# Patient Record
Sex: Female | Born: 1990 | Race: Asian | Hispanic: No | Marital: Married | Smoking: Never smoker
Health system: Southern US, Community
[De-identification: ages and names within clinical notes are randomized; demographics above are authoritative.]

---

## 2019-02-13 LAB — OB RESULTS CONSOLE ABO/RH: RH Type: POSITIVE

## 2019-02-13 LAB — OB RESULTS CONSOLE RUBELLA ANTIBODY, IGM: Rubella: IMMUNE

## 2019-02-13 LAB — OB RESULTS CONSOLE GC/CHLAMYDIA
Chlamydia: NEGATIVE
Gonorrhea: NEGATIVE

## 2019-02-13 LAB — OB RESULTS CONSOLE RPR: RPR: NONREACTIVE

## 2019-02-13 LAB — OB RESULTS CONSOLE HEPATITIS B SURFACE ANTIGEN: Hepatitis B Surface Ag: NEGATIVE

## 2019-02-13 LAB — OB RESULTS CONSOLE HIV ANTIBODY (ROUTINE TESTING): HIV: NONREACTIVE

## 2019-02-13 LAB — OB RESULTS CONSOLE ANTIBODY SCREEN: Antibody Screen: NEGATIVE

## 2019-06-13 LAB — OB RESULTS CONSOLE GBS: GBS: NEGATIVE

## 2019-07-12 ENCOUNTER — Other Ambulatory Visit: Payer: Self-pay | Admitting: Obstetrics and Gynecology

## 2019-07-13 ENCOUNTER — Telehealth (HOSPITAL_COMMUNITY): Payer: Self-pay | Admitting: *Deleted

## 2019-07-13 ENCOUNTER — Encounter (HOSPITAL_COMMUNITY): Payer: Self-pay | Admitting: *Deleted

## 2019-07-13 NOTE — Telephone Encounter (Signed)
Preadmission screen  

## 2019-07-14 NOTE — L&D Delivery Note (Signed)
Pt continued to progress spontaneously to C/C/+1. She had an epidural. She labored down then began to push. She pushed for 45 min and had a SVD of one live viable female infant over a second degree midline tear. Placenta-S/I with calcifications. She had some mild uterine atony txd with one dose of methergine. Tear closed with 3-0 vicryl. Baby to [facility] [id number]

## 2019-07-15 ENCOUNTER — Other Ambulatory Visit (HOSPITAL_COMMUNITY): Admission: RE | Admit: 2019-07-15 | Payer: Medicaid Other | Source: Ambulatory Visit

## 2019-07-16 ENCOUNTER — Other Ambulatory Visit: Payer: Self-pay

## 2019-07-17 ENCOUNTER — Inpatient Hospital Stay (HOSPITAL_COMMUNITY): Payer: Medicaid Other | Admitting: Anesthesiology

## 2019-07-17 ENCOUNTER — Encounter (HOSPITAL_COMMUNITY): Payer: Self-pay | Admitting: Obstetrics and Gynecology

## 2019-07-17 ENCOUNTER — Inpatient Hospital Stay (HOSPITAL_COMMUNITY): Payer: Medicaid Other

## 2019-07-17 ENCOUNTER — Inpatient Hospital Stay (HOSPITAL_COMMUNITY)
Admission: AD | Admit: 2019-07-17 | Discharge: 2019-07-19 | DRG: 807 | Disposition: A | Discharge status: Home / Self Care | Payer: Medicaid Other | Attending: Obstetrics and Gynecology | Admitting: Obstetrics and Gynecology

## 2019-07-17 ENCOUNTER — Other Ambulatory Visit: Payer: Self-pay

## 2019-07-17 ENCOUNTER — Other Ambulatory Visit (HOSPITAL_COMMUNITY): Payer: Self-pay

## 2019-07-17 DIAGNOSIS — O9902 Anemia complicating childbirth: Secondary | ICD-10-CM | POA: Diagnosis present

## 2019-07-17 DIAGNOSIS — Z20822 Contact with and (suspected) exposure to covid-19: Secondary | ICD-10-CM | POA: Diagnosis present

## 2019-07-17 DIAGNOSIS — O480 Post-term pregnancy: Secondary | ICD-10-CM | POA: Diagnosis present

## 2019-07-17 DIAGNOSIS — D649 Anemia, unspecified: Secondary | ICD-10-CM | POA: Diagnosis present

## 2019-07-17 DIAGNOSIS — Z3A41 41 weeks gestation of pregnancy: Secondary | ICD-10-CM

## 2019-07-17 DIAGNOSIS — Z3490 Encounter for supervision of normal pregnancy, unspecified, unspecified trimester: Secondary | ICD-10-CM

## 2019-07-17 LAB — CBC
HCT: 40.1 % (ref 36.0–46.0)
Hemoglobin: 12.3 g/dL (ref 12.0–15.0)
MCH: 23.9 pg — ABNORMAL LOW (ref 26.0–34.0)
MCHC: 30.7 g/dL (ref 30.0–36.0)
MCV: 78 fL — ABNORMAL LOW (ref 80.0–100.0)
Platelets: 218 10*3/uL (ref 150–400)
RBC: 5.14 MIL/uL — ABNORMAL HIGH (ref 3.87–5.11)
RDW: 14.5 % (ref 11.5–15.5)
WBC: 7.6 10*3/uL (ref 4.0–10.5)
nRBC: 0 % (ref 0.0–0.2)

## 2019-07-17 LAB — TYPE AND SCREEN
ABO/RH(D): O POS
Antibody Screen: NEGATIVE

## 2019-07-17 LAB — ABO/RH: ABO/RH(D): O POS

## 2019-07-17 LAB — SARS CORONAVIRUS 2 (TAT 6-24 HRS): SARS Coronavirus 2: NEGATIVE

## 2019-07-17 MED ORDER — OXYCODONE-ACETAMINOPHEN 5-325 MG PO TABS: 2.0000 | ORAL_TABLET | ORAL | Status: DC | PRN

## 2019-07-17 MED ORDER — OXYTOCIN 40 UNITS IN NORMAL SALINE INFUSION - SIMPLE MED
2.5000 [IU]/h | INTRAVENOUS | Status: DC
  Filled 2019-07-17: qty 1000

## 2019-07-17 MED ORDER — LACTATED RINGERS IV SOLN: INTRAVENOUS | Status: DC

## 2019-07-17 MED ORDER — ONDANSETRON HCL 4 MG/2ML IJ SOLN: 4.0000 mg | INTRAMUSCULAR | Status: DC | PRN

## 2019-07-17 MED ORDER — TERBUTALINE SULFATE 1 MG/ML IJ SOLN: 0.2500 mg | Freq: Once | INTRAMUSCULAR | Status: DC | PRN

## 2019-07-17 MED ORDER — WITCH HAZEL-GLYCERIN EX PADS: 1.0000 "application " | MEDICATED_PAD | CUTANEOUS | Status: DC | PRN

## 2019-07-17 MED ORDER — SENNOSIDES-DOCUSATE SODIUM 8.6-50 MG PO TABS
2.0000 | ORAL_TABLET | ORAL | Status: DC
  Administered 2019-07-18 (×2): 2 via ORAL
  Filled 2019-07-17 (×2): qty 2

## 2019-07-17 MED ORDER — OXYTOCIN 40 UNITS IN NORMAL SALINE INFUSION - SIMPLE MED: 1.0000 m[IU]/min | INTRAVENOUS | Status: DC

## 2019-07-17 MED ORDER — BENZOCAINE-MENTHOL 20-0.5 % EX AERO
1.0000 "application " | INHALATION_SPRAY | CUTANEOUS | Status: DC | PRN
  Administered 2019-07-17: 1 via TOPICAL
  Filled 2019-07-17: qty 56

## 2019-07-17 MED ORDER — LIDOCAINE HCL (PF) 1 % IJ SOLN
INTRAMUSCULAR | Status: DC | PRN
  Administered 2019-07-17: 6 mL via EPIDURAL

## 2019-07-17 MED ORDER — METHYLERGONOVINE MALEATE 0.2 MG/ML IJ SOLN
INTRAMUSCULAR | Status: AC
  Filled 2019-07-17: qty 1

## 2019-07-17 MED ORDER — IBUPROFEN 600 MG PO TABS
600.0000 mg | ORAL_TABLET | Freq: Four times a day (QID) | ORAL | Status: DC
  Administered 2019-07-17 – 2019-07-18 (×6): 600 mg via ORAL
  Filled 2019-07-17 (×7): qty 1

## 2019-07-17 MED ORDER — COCONUT OIL OIL: 1.0000 "application " | TOPICAL_OIL | Status: DC | PRN

## 2019-07-17 MED ORDER — SODIUM CHLORIDE (PF) 0.9 % IJ SOLN
INTRAMUSCULAR | Status: DC | PRN
  Administered 2019-07-17: 12 mL/h via EPIDURAL

## 2019-07-17 MED ORDER — FENTANYL-BUPIVACAINE-NACL 0.5-0.125-0.9 MG/250ML-% EP SOLN
EPIDURAL | Status: AC
  Filled 2019-07-17: qty 250

## 2019-07-17 MED ORDER — ACETAMINOPHEN 325 MG PO TABS
650.0000 mg | ORAL_TABLET | ORAL | Status: DC | PRN
  Administered 2019-07-17: 650 mg via ORAL
  Filled 2019-07-17: qty 2

## 2019-07-17 MED ORDER — TETANUS-DIPHTH-ACELL PERTUSSIS 5-2.5-18.5 LF-MCG/0.5 IM SUSP: 0.5000 mL | Freq: Once | INTRAMUSCULAR | Status: DC

## 2019-07-17 MED ORDER — ACETAMINOPHEN 325 MG PO TABS: 650.0000 mg | ORAL_TABLET | ORAL | Status: DC | PRN

## 2019-07-17 MED ORDER — LIDOCAINE HCL (PF) 1 % IJ SOLN: 30.0000 mL | INTRAMUSCULAR | Status: DC | PRN

## 2019-07-17 MED ORDER — FENTANYL CITRATE (PF) 100 MCG/2ML IJ SOLN: 100.0000 ug | Freq: Once | INTRAMUSCULAR | Status: DC

## 2019-07-17 MED ORDER — OXYTOCIN BOLUS FROM INFUSION
500.0000 mL | Freq: Once | INTRAVENOUS | Status: AC
  Administered 2019-07-17: 500 mL via INTRAVENOUS

## 2019-07-17 MED ORDER — ONDANSETRON HCL 4 MG PO TABS: 4.0000 mg | ORAL_TABLET | ORAL | Status: DC | PRN

## 2019-07-17 MED ORDER — MISOPROSTOL 25 MCG QUARTER TABLET
25.0000 ug | ORAL_TABLET | ORAL | Status: DC | PRN
  Administered 2019-07-17: 25 ug via VAGINAL
  Filled 2019-07-17 (×2): qty 1

## 2019-07-17 MED ORDER — MEASLES, MUMPS & RUBELLA VAC IJ SOLR: 0.5000 mL | Freq: Once | INTRAMUSCULAR | Status: DC

## 2019-07-17 MED ORDER — FENTANYL CITRATE (PF) 100 MCG/2ML IJ SOLN
INTRAMUSCULAR | Status: AC
  Administered 2019-07-17: 100 ug via INTRAVENOUS
  Filled 2019-07-17: qty 2

## 2019-07-17 MED ORDER — DIBUCAINE (PERIANAL) 1 % EX OINT: 1.0000 "application " | TOPICAL_OINTMENT | CUTANEOUS | Status: DC | PRN

## 2019-07-17 MED ORDER — ONDANSETRON HCL 4 MG/2ML IJ SOLN: 4.0000 mg | Freq: Four times a day (QID) | INTRAMUSCULAR | Status: DC | PRN

## 2019-07-17 MED ORDER — SOD CITRATE-CITRIC ACID 500-334 MG/5ML PO SOLN: 30.0000 mL | ORAL | Status: DC | PRN

## 2019-07-17 MED ORDER — OXYCODONE-ACETAMINOPHEN 5-325 MG PO TABS: 1.0000 | ORAL_TABLET | ORAL | Status: DC | PRN

## 2019-07-17 MED ORDER — FENTANYL CITRATE (PF) 100 MCG/2ML IJ SOLN: 100.0000 ug | INTRAMUSCULAR | Status: DC | PRN

## 2019-07-17 MED ORDER — SIMETHICONE 80 MG PO CHEW: 80.0000 mg | CHEWABLE_TABLET | ORAL | Status: DC | PRN

## 2019-07-17 MED ORDER — METHYLERGONOVINE MALEATE 0.2 MG/ML IJ SOLN
0.2000 mg | Freq: Once | INTRAMUSCULAR | Status: AC
  Administered 2019-07-17: 0.2 mg via INTRAMUSCULAR

## 2019-07-17 MED ORDER — ZOLPIDEM TARTRATE 5 MG PO TABS: 5.0000 mg | ORAL_TABLET | Freq: Every evening | ORAL | Status: DC | PRN

## 2019-07-17 MED ORDER — LACTATED RINGERS IV SOLN: 500.0000 mL | INTRAVENOUS | Status: DC | PRN

## 2019-07-17 NOTE — Addendum Note (Signed)
Addendum  created 07/17/19 1924 by Lenox Ahr, CRNA   Charge Capture section accepted

## 2019-07-17 NOTE — Lactation Note (Signed)
This note was copied from a Mary Myers's chart. Lactation Consultation Note  Patient Name: Mary Myers Today's Date: 07/17/2019 Reason for consult: Initial assessment;Primapara;1st time breastfeeding;Term  Interpretor: Mary Myers [id number]. I assisted Mary Myers with breast feeding. Mary Myers has not latched to date. She has everted nipples and wide spaced breasts. We practiced hand expression, and her colostrum is easily expressed.   Mary Myers has been very sleepy today. I attempted to wake by changing a large meconium diaper. He roused somewhat and we practiced football hold and cradle hold while Mary Myers was STS. He licked colostrum off the breast and nuzzled and opened to latch a few times, but he never fully latched. He did some smacking/kissing at the breast. He seemed almost ready to latch, but we were unable to attain it.  I answered questions about how to know if Mary Myers is getting enough to eat. I recommended that she hold Mary Myers skin to skin when she is awake and to allow Mary Myers to explore the breast. While formula is not yet medically necessary, I indicated that this option is available to her if she is concerned, and I recommended that she contact her RN about this tonight, as needed. I suggested that she begin pumping if she did opt to supplement for stimulation purposes. Mary Myers verbalized understanding of this plan.  I recommended LC follow up tomorrow.  Maternal Data Formula Feeding for Exclusion: No Has patient been taught Hand Expression?: Yes Does the patient have breastfeeding experience prior to this delivery?: No  Feeding Feeding Type: Breast Fed  LATCH Score Latch: Too sleepy or reluctant, no latch achieved, no sucking elicited.  Audible Swallowing: None  Type of Nipple: Everted at rest and after stimulation  Comfort (Breast/Nipple): Soft / non-tender  Hold (Positioning): Assistance needed to correctly position infant at breast and maintain latch.  LATCH Score: 5  Interventions Interventions:  Breast feeding basics reviewed;Assisted with latch;Skin to skin;Hand express;Breast compression;Adjust position;Support pillows;Position options   Consult Status Consult Status: Follow-up Date: 07/18/19 Follow-up type: In-patient    Mary Myers 07/17/2019, 11:44 PM

## 2019-07-17 NOTE — Anesthesia Preprocedure Evaluation (Signed)
Anesthesia Evaluation  Patient identified by MRN, date of birth, ID band Patient awake    Reviewed: Allergy & Precautions, H&P , NPO status , Patient's Chart, lab work & pertinent test results, reviewed documented beta blocker date and time   Airway Mallampati: II  TM Distance: >3 FB Neck ROM: full    Dental no notable dental hx.    Pulmonary neg pulmonary ROS,    Pulmonary exam normal breath sounds clear to auscultation       Cardiovascular negative cardio ROS Normal cardiovascular exam Rhythm:regular Rate:Normal     Neuro/Psych negative neurological ROS  negative psych ROS   GI/Hepatic negative GI ROS, Neg liver ROS,   Endo/Other  negative endocrine ROS  Renal/GU negative Renal ROS  negative genitourinary   Musculoskeletal   Abdominal   Peds  Hematology negative hematology ROS (+)   Anesthesia Other Findings   Reproductive/Obstetrics (+) Pregnancy                             Anesthesia Physical Anesthesia Plan  ASA: II  Anesthesia Plan: Epidural   Post-op Pain Management:    Induction:   PONV Risk Score and Plan:   Airway Management Planned:   Additional Equipment:   Intra-op Plan:   Post-operative Plan:   Informed Consent: I have reviewed the patients History and Physical, chart, labs and discussed the procedure including the risks, benefits and alternatives for the proposed anesthesia with the patient or authorized representative who has indicated Raelea/her understanding and acceptance.     Dental Advisory Given  Plan Discussed with: Anesthesiologist  Anesthesia Plan Comments: (Labs checked- platelets confirmed with RN in room. Fetal heart tracing, per RN, reported to be stable enough for sitting procedure. Discussed epidural, and patient consents to the procedure:  included risk of possible headache,backache, failed block, allergic reaction, and nerve injury. This  patient was asked if she had any questions or concerns before the procedure started.)        Anesthesia Quick Evaluation  

## 2019-07-17 NOTE — Anesthesia Postprocedure Evaluation (Signed)
Anesthesia Post Note  Patient: Mary Myers  Procedure(s) Performed: AN AD HOC LABOR EPIDURAL     Patient location during evaluation: Mother Baby Anesthesia Type: Epidural Level of consciousness: awake and alert Pain management: pain level controlled Vital Signs Assessment: post-procedure vital signs reviewed and stable Respiratory status: spontaneous breathing, nonlabored ventilation and respiratory function stable Cardiovascular status: stable Postop Assessment: no headache, no backache, epidural receding, patient able to bend at knees, no apparent nausea or vomiting, adequate PO intake and able to ambulate Anesthetic complications: no    Last Vitals:  Vitals:   07/17/19 1845 07/17/19 1921  BP:  111/76  Pulse:  85  Resp:  16  Temp: 37.1 C 36.9 C  SpO2:  99%    Last Pain:  Vitals:   07/17/19 1921  TempSrc: Oral  PainSc:    Pain Goal:                   Blythe Stanford

## 2019-07-17 NOTE — Anesthesia Procedure Notes (Signed)
Epidural Patient location during procedure: OB Start time: 07/17/2019 6:44 AM End time: 07/17/2019 6:46 AM  Staffing Anesthesiologist: Bethena Midget, MD  Preanesthetic Checklist Completed: patient identified, IV checked, site marked, risks and benefits discussed, surgical consent, monitors and equipment checked, pre-op evaluation and timeout performed  Epidural Patient position: sitting Prep: DuraPrep and site prepped and draped Patient monitoring: continuous pulse ox and blood pressure Approach: midline Location: L3-L4 Injection technique: LOR air  Needle:  Needle type: Tuohy  Needle gauge: 17 G Needle length: 9 cm and 9 Needle insertion depth: 6 cm Catheter type: closed end flexible Catheter size: 19 Gauge Catheter at skin depth: 11 cm Test dose: negative  Assessment Events: blood not aspirated, injection not painful, no injection resistance, no paresthesia and negative IV test

## 2019-07-17 NOTE — H&P (Signed)
Mary Myers is an 29 y.o. G1P0 [redacted]w[redacted]d asian female who is admitted for cytotec induction for a post term preg. PNC was complicated by late [facility]. First visit was 19 weeks. Failed OGTT, passed 3 hour. GBS -neg. On admission she was 1 cm. She received one cytotec and then went into labor. Cx now 7cm , Pt had SROM  History reviewed. No pertinent past medical history.  History reviewed. No pertinent surgical history.  History reviewed. No pertinent family history. Social History:  has no history on file for tobacco, alcohol, and drug.  Allergies: No Known Allergies  No medications prior to admission.       Blood pressure (!) 126/93, pulse 97, temperature 98.8 F (37.1 C), temperature source Oral, resp. rate 17, height 5\' 2"  (1.575 m), weight 72.6 kg, last menstrual period 10/03/2018, SpO2 97 %. General appearance: alert, cooperative and mild distress Abdomen: gravid, non tender   Lab Results  Component Value Date   WBC 7.6 07/17/2019   HGB 12.3 07/17/2019   HCT 40.1 07/17/2019   MCV 78.0 (L) 07/17/2019   PLT 218 07/17/2019   No results found for: PREGTESTUR, PREGSERUM, HCG, HCGQUANT    Patient Active Problem List   Diagnosis Date Noted  . Post-dates pregnancy 07/17/2019   IMP/ IUP at 41 wks         Late [facility] Plan/ Admit           Start induction  Raye Wiens E 07/17/2019, 8:58 AM

## 2019-07-18 LAB — CBC
HCT: 29.2 % — ABNORMAL LOW (ref 36.0–46.0)
Hemoglobin: 9.3 g/dL — ABNORMAL LOW (ref 12.0–15.0)
MCH: 24.2 pg — ABNORMAL LOW (ref 26.0–34.0)
MCHC: 31.8 g/dL (ref 30.0–36.0)
MCV: 76 fL — ABNORMAL LOW (ref 80.0–100.0)
Platelets: 189 10*3/uL (ref 150–400)
RBC: 3.84 MIL/uL — ABNORMAL LOW (ref 3.87–5.11)
RDW: 14.2 % (ref 11.5–15.5)
WBC: 11.6 10*3/uL — ABNORMAL HIGH (ref 4.0–10.5)
nRBC: 0 % (ref 0.0–0.2)

## 2019-07-18 LAB — RPR: RPR Ser Ql: NONREACTIVE

## 2019-07-18 MED ORDER — FERROUS SULFATE 325 (65 FE) MG PO TABS
325.0000 mg | ORAL_TABLET | Freq: Every day | ORAL | Status: DC
  Administered 2019-07-18 – 2019-07-19 (×2): 325 mg via ORAL
  Filled 2019-07-18 (×2): qty 1

## 2019-07-18 NOTE — Lactation Note (Signed)
This note was copied from a baby's chart. Lactation Consultation Note  Patient Name: Mary Myers Today's Date: 07/18/2019 Reason for consult: Follow-up assessment;Term;Primapara;1st time breastfeeding  P1 mother whose infant is now 51 hours old.  Mother's feeding preference is breast/bottle.  [place] interpreter [phone]) used for interpretation.  Mother informed me that she has been putting baby to the breast but desires to supplement because she does not believe he is "getting enough."  Reviewed breast feeding basics and how to be sure her milk supply gets well established.  She has not been latching at every feeding.  Suggested she latch to the breast first with every feeding so he is able to learn how to latch and breast feed.  After breast feeding, if needed, she can supplement with formula.  Mother's breasts are soft and non tender and nipples are everted and intact.  Mother is able to perform hand expression.  Suggested ways mother can help awaken baby prior to latching.  Encouraged lots of STS and no blankets or clothing during feedings.  Mother has not been interested in pumping.  Asked her to call her RN and I will be glad to return to help with latching and feeding.  Mother verbalized understanding.  I will do some further "hands on" breastfeeding education at this time.    RN in room obtaining 24 hour lab tests and is aware of this plan.      Maternal Data    Feeding Feeding Type: Bottle Fed - Formula Nipple Type: Slow - flow  LATCH Score                   Interventions    Lactation Tools Discussed/Used     Consult Status Consult Status: Follow-up Date: 07/19/19 Follow-up type: In-patient    Dora Sims 07/18/2019, 4:42 PM

## 2019-07-18 NOTE — Lactation Note (Signed)
This note was copied from a baby's chart. Lactation Consultation Note  Patient Name: Mary Myers Today's Date: 07/18/2019 Reason for consult: Follow-up assessment  P1 mother whose infant is now 73 hours old.    [place] interpreter, [phone]) used for interpretation.  Mother called for latch assistance as requested.  Baby fed 20 mls of formula 2 1/2 hours ago.  Mother stated he was showing feeding cues, although I did not observe any cues when I entered.  Offered to assist and mother accepted.  Reviewed the cross cradle position with mother and placed pillows for support.  Attempted to latch onto breast but baby was not interested in opening [name] mouth.  Demonstrated gentle stimulation, burping and rubbing [name] back to awaken him.  He began to awake and opened [name] eyes.  Attempted to latch a second time and was able to latch.  However, once at the breast he fell asleep.  Suggested mother try the football hold.  Again, he was able to latch but pushed back off the breast and showed no interest.  Placed him STS on mother's chest and he fell asleep.  Asked mother to observe him for feeding cues and try again by 2000 at the latest.  Explained how she can awaken him prior to latching.  If he is not interested in latching at that time mother will supplement with formula.  Encouraged her to call her RN for assistance.  Mother verbalized understanding.  Explained that he should begin waking up tonight and may be more interested during the night time hours.  Either way, we will provide supplementation.  Father present.  RN in room and updated.   Maternal Data    Feeding Feeding Type: Bottle Fed - Formula Nipple Type: Slow - flow  LATCH Score                   Interventions    Lactation Tools Discussed/Used     Consult Status Consult Status: Follow-up Date: 07/19/19 Follow-up type: In-patient    Dora Sims 07/18/2019, 6:48 PM

## 2019-07-18 NOTE — Progress Notes (Signed)
Patient is eating, ambulating, voiding.  Pain control is good.  Vitals:   07/17/19 1845 07/17/19 1921 07/18/19 0013 07/18/19 0404  BP:  111/76 116/79 95/71  Pulse:  85 86 74  Resp:  16 18 16   Temp: 98.8 F (37.1 C) 98.5 F (36.9 C) 98 F (36.7 C) 98.3 F (36.8 C)  TempSrc: Oral Oral Oral Oral  SpO2:  99% 100% 100%  Weight:      Height:        Fundus firm Perineum without swelling.  Lab Results  Component Value Date   WBC 11.6 (H) 07/18/2019   HGB 9.3 (L) 07/18/2019   HCT 29.2 (L) 07/18/2019   MCV 76.0 (L) 07/18/2019   PLT 189 07/18/2019    --/--/O POS, O POS Performed at [facility], [address] [place], [place]  (01/04 0054)/[place]  A/P Post partum day 1.  Routine care.  Expect d/c tomorrow.  Iron for anemia. 05-21-1968

## 2019-07-19 NOTE — Lactation Note (Signed)
This note was copied from a baby's chart. Lactation Consultation Note  Patient Name: Mary Myers Today's Date: 07/19/2019 Reason for consult: Follow-up assessment;Primapara;1st time breastfeeding;Term  [place] interpreter [phone]) Asked Mom if she needed help.  Mom said she is feeding baby formula due to not having milk in her breasts yet.  Reviewed supply meets demand concept and importance of baby breastfeeding often, to stimulate a full milk supply.  Mom states she puts baby on the breast first, before giving formula.  Baby hasn't seemed very hungry when getting latch assistance.  Explained how formula is digested slowly.  Talked about pumping.  Mom has WIC and offered to arrange for her to get a DEBP, but Mom declined.  Hand pump given with explanation on use and cleaning of pump.   Engorgement prevention and treatment reviewed.  Offered to assist with latching, Mom declined saying they are leaving soon.  Mom says she doesn't need help with latching as baby can latch and breastfeed for 10-20 mins. Lactation brochure given and shown where phone number is for lactation assistance. Encouraged STS and feeding baby often with cues (>8 times per 24 hrs)  Interventions Interventions: Breast feeding basics reviewed;Skin to skin;Breast massage;Hand express;Hand pump  Lactation Tools Discussed/Used Tools: Bottle   Consult Status Consult Status: Complete Date: 07/19/19 Follow-up type: Call as needed    Judee Clara 07/19/2019, 11:52 AM

## 2019-07-19 NOTE — Discharge Summary (Addendum)
Obstetric Discharge Summary Reason for Admission: induction of labor and post dates Prenatal Procedures: ultrasound Intrapartum Procedures: spontaneous vaginal delivery Postpartum Procedures: none Complications-Operative and Postpartum: 2nd degree perineal laceration Hemoglobin  Date Value Ref Range Status  07/18/2019 9.3 (L) 12.0 - 15.0 g/dL Final    Comment:    REPEATED TO VERIFY   HCT  Date Value Ref Range Status  07/18/2019 29.2 (L) 36.0 - 46.0 % Final    Physical Exam:  General: alert and cooperative Lochia: appropriate Uterine Fundus: firm DVT Evaluation: No evidence of DVT seen on physical exam.  Discharge Diagnoses: Term Pregnancy-delivered  Discharge Information: Date: 07/19/2019 Activity: pelvic rest Diet: routine Medications: PNV, Ibuprofen, Iron and tylenol Condition: stable Instructions: refer to practice specific booklet Discharge to: home Follow-up Information    Levi Aland, MD Follow up in 4 week(s).   Specialty: Obstetrics and Gynecology Contact information: [address] [place] [address] [phone]           Newborn Data: Live born female  Birth Weight: 8 lb 1.8 oz (3680 g) APGAR: 8, 9  Newborn Delivery   Birth date/time: 07/17/2019 12:53:00 Delivery type: Vaginal, Spontaneous     Patient declines circumcision. Home with mother. [place] interpreter [phone] used for entire conversation and exam, all questions answered. Philip Aspen 07/19/2019, 9:54 AM

## 2019-07-19 NOTE — Progress Notes (Signed)
Discharge instructions communicated dc instructions.  Interpreter Daryl [place] [phone]

## 2019-08-16 ENCOUNTER — Ambulatory Visit (HOSPITAL_COMMUNITY)
Admission: EM | Admit: 2019-08-16 | Discharge: 2019-08-16 | Disposition: A | Discharge status: Home / Self Care | Payer: Medicaid Other

## 2019-08-16 ENCOUNTER — Encounter (HOSPITAL_COMMUNITY): Payer: Self-pay

## 2019-08-16 ENCOUNTER — Ambulatory Visit: Payer: Medicaid Other | Attending: Internal Medicine

## 2019-08-16 ENCOUNTER — Other Ambulatory Visit: Payer: Self-pay

## 2019-08-16 DIAGNOSIS — N644 Mastodynia: Secondary | ICD-10-CM | POA: Diagnosis not present

## 2019-08-16 DIAGNOSIS — O9229 Other disorders of breast associated with pregnancy and the puerperium: Secondary | ICD-10-CM

## 2019-08-16 DIAGNOSIS — O9279 Other disorders of lactation: Secondary | ICD-10-CM | POA: Diagnosis not present

## 2019-08-16 DIAGNOSIS — Z20822 Contact with and (suspected) exposure to covid-19: Secondary | ICD-10-CM

## 2019-08-16 MED ORDER — IBUPROFEN 600 MG PO TABS: 600.0000 mg | ORAL_TABLET | Freq: Four times a day (QID) | ORAL | 0 refills | Status: DC | PRN

## 2019-08-16 NOTE — Discharge Instructions (Addendum)
I believe this is a blocked milk duct.  You need to do warm compresses prior to feeding the baby.  Massage the area and feed the baby frequently or pump frequently.  Take the ibuprofen for pain as needed.  You can also try changing the babies position during feeding.  If this continues you need to see our OB/GYN or lactation consultant.  Ti tin r?ng ?y l m?t ?ng d?n s?a b? t?c. B?n c?n ch??m ?m tr??c khi cho tr? b. Xoa bp khu v?c ny v cho tr? b th??ng xuyn ho?c b?m h?i th??ng xuyn. U?ng ibuprofen ?? gi?m ?au khi c?n thi?t. B?n c?ng c th? th? thay ??i t? th? cho tr? trong khi cho tr? b. N?u ?i?u ny v?n ti?p di?n, b?n c?n g?p bc s? s?n ph? khoa ho?c chuyn gia t? v?n cho con b c?a chng ti.

## 2019-08-16 NOTE — ED Triage Notes (Addendum)
Pt presents to UC with left sided rib cage x 3 days. Pt reports is worse when taking a deep breath. Pt taking Tylenol without relief. Pt reports having abdominal pain x 1 week.

## 2019-08-17 LAB — NOVEL CORONAVIRUS, NAA: SARS-CoV-2, NAA: DETECTED — AB

## 2019-08-17 NOTE — ED Provider Notes (Signed)
[facility]    CSN: [id number] Arrival date & time: 08/16/19  1243      History   Chief Complaint Chief Complaint  Patient presents with  . rib cage pain  . Abdominal Pain    HPI Aylissa Luster is a 29 y.o. female.   Patient is a 29 year old female that presents today for bilateral lower breast discomfort.  Symptoms have been constant for the past 3 days.  Worse on the right.  She has been taking Tylenol for  Pain with some relief.  Patient is currently breast-feeding.  Had a live birth approximately a month ago.  Reporting that she breast-feeds every couple hours.  Has not had any fever, chills, bodies or night sweats.   All information obtained using the Psychiatric nurse.   ROS per HPI      History reviewed. No pertinent past medical history.  Patient Active Problem List   Diagnosis Date Noted  . Post-dates pregnancy 07/17/2019  . Pregnancy 07/17/2019    History reviewed. No pertinent surgical history.  OB History    Gravida  1   Para  1   Term  1   Preterm      AB      Living  1     SAB      TAB      Ectopic      Multiple  0   Live Births  1            Home Medications    Prior to Admission medications   Medication Sig Start Date End Date Taking? Authorizing Provider  acetaminophen (TYLENOL) 500 MG tablet Take 500 mg by mouth every 6 (six) hours as needed.   Yes [provider]  ibuprofen (ADVIL) 600 MG tablet Take 1 tablet (600 mg total) by mouth every 6 (six) hours as needed. 08/16/19   Orvan July, NP    Family History History reviewed. No pertinent family history.  Social History Social History   Tobacco Use  . Smoking status: Never Smoker  . Smokeless tobacco: Never Used  Substance Use Topics  . Alcohol use: Never  . Drug use: Never     Allergies   Patient has no known allergies.   Review of Systems Review of Systems   Physical Exam Triage Vital Signs ED Triage Vitals  Enc Vitals Group   BP 08/16/19 1337 118/81     Pulse Rate 08/16/19 1337 77     Resp 08/16/19 1337 16     Temp 08/16/19 1337 98.9 F (37.2 C)     Temp Source 08/16/19 1337 Oral     SpO2 08/16/19 1337 99 %     Weight --      Height --      Head Circumference --      Peak Flow --      Pain Score 08/16/19 1341 6     Pain Loc --      Pain Edu? --      Excl. in [place]? --    No data found.  Updated Vital Signs BP 118/81 (BP Location: Right Arm)   Pulse 77   Temp 98.9 F (37.2 C) (Oral)   Resp 16   LMP 10/03/2018   SpO2 99%   Breastfeeding Yes   Visual Acuity Right Eye Distance:   Left Eye Distance:   Bilateral Distance:    Right Eye Near:   Left Eye Near:    Bilateral Near:  Physical Exam Vitals and nursing note reviewed.  Constitutional:      General: She is not in acute distress.    Appearance: Normal appearance. She is not ill-appearing, toxic-appearing or diaphoretic.  HENT:     Head: Normocephalic.     Nose: Nose normal.  Eyes:     Conjunctiva/sclera: Conjunctivae normal.  Pulmonary:     Effort: Pulmonary effort is normal.  Chest:       Comments: Mild erythema and tenderness to bilateral breast Palpated firm areas to both breasts at the 6 o'clock area.  Musculoskeletal:        General: Normal range of motion.     Cervical back: Normal range of motion.  Skin:    General: Skin is warm and dry.     Findings: No rash.  Neurological:     Mental Status: She is alert.  Psychiatric:        Mood and Affect: Mood normal.      UC Treatments / Results  Labs (all labs ordered are listed, but only abnormal results are displayed) Labs Reviewed - No data to display  EKG   Radiology No results found.  Procedures Procedures (including critical care time)  Medications Ordered in UC Medications - No data to display  Initial Impression / Assessment and Plan / UC Course  I have reviewed the triage vital signs and the nursing notes.  Pertinent labs & imaging results that  were available during my care of the patient were reviewed by me and considered in my medical decision making (see chart for details).     Breast pain- most likely from blocked ducts.  We will have her do warm compresses prior to each feeding.  Recommended massaging the area and feeding the baby frequently or pumping frequently.  Ibuprofen as needed for pain every 8 hours.  Recommend also change the baby's position during feeding Gave contact for lactation consultant to use as needed   To follow-up as neededto follow-up as needed. Final Clinical Impressions(s) / UC Diagnoses   Final diagnoses:  Pain of breast during breastfeeding     Discharge Instructions     I believe this is a blocked milk duct.  You need to do warm compresses prior to feeding the baby.  Massage the area and feed the baby frequently or pump frequently.  Take the ibuprofen for pain as needed.  You can also try changing the babies position during feeding.  If this continues you need to see our OB/GYN or lactation consultant.  Ti tin r?ng ?y l m?t ?ng d?n s?a b? t?c. B?n c?n ch??m ?m tr??c khi cho tr? b. [place] bp khu v?c ny v cho tr? b th??ng xuyn ho?c b?m h?i th??ng xuyn. U?ng ibuprofen ?? gi?m ?au khi c?n thi?t. B?n c?ng c th? th? thay ??i t? th? cho tr? trong khi cho tr? b. N?u ?i?u ny v?n ti?p di?n, b?n c?n g?p bc s? s?n ph? khoa ho?c chuyn gia t? v?n cho con b c?a chng ti.     ED Prescriptions    Medication Sig Dispense Auth. Provider   ibuprofen (ADVIL) 600 MG tablet Take 1 tablet (600 mg total) by mouth every 6 (six) hours as needed. 30 tablet Dahlia Byes A, NP     PDMP not reviewed this encounter.   Dahlia Byes A, NP 08/17/19 [phone]

## 2019-12-13 ENCOUNTER — Other Ambulatory Visit: Payer: Self-pay

## 2019-12-13 ENCOUNTER — Ambulatory Visit (HOSPITAL_COMMUNITY)
Admission: EM | Admit: 2019-12-13 | Discharge: 2019-12-13 | Disposition: A | Discharge status: Home / Self Care | Payer: Medicaid Other | Attending: Family Medicine | Admitting: Family Medicine

## 2019-12-13 ENCOUNTER — Encounter (HOSPITAL_COMMUNITY): Payer: Self-pay

## 2019-12-13 DIAGNOSIS — H02844 Edema of left upper eyelid: Secondary | ICD-10-CM | POA: Diagnosis not present

## 2019-12-13 MED ORDER — TOBRAMYCIN 0.3 % OP SOLN: 1.0000 [drp] | Freq: Three times a day (TID) | OPHTHALMIC | 0 refills | Status: DC

## 2019-12-13 MED ORDER — PREDNISONE 10 MG (21) PO TBPK: ORAL_TABLET | Freq: Every day | ORAL | 0 refills | Status: DC

## 2019-12-13 NOTE — ED Provider Notes (Signed)
[facility]   [id number] 12/13/19 Arrival Time: 1004  ASSESSMENT & PLAN:  1. Swelling of left upper eyelid     Early stye vs allergic reaction.  Begin: Meds ordered this encounter  Medications   predniSONE (STERAPRED UNI-PAK 21 TAB) 10 MG (21) TBPK tablet    Sig: Take by mouth daily. Take as directed.    Dispense:  21 tablet    Refill:  0   tobramycin (TOBREX) 0.3 % ophthalmic solution    Sig: Place 1 drop into the right eye every 8 (eight) hours. For up to one week.    Dispense:  5 mL    Refill:  0    Ophthalmic drops per orders. Warm compress to eye(s). Local eye care discussed.  Reviewed expectations re: course of current medical issues. Questions answered. Outlined signs and symptoms indicating need for more acute intervention. Patient verbalized understanding. After Visit Summary given.   SUBJECTIVE: [place] video interpreter used.  Mary Myers is a 29 y.o. female who presents with complaint of swelling of her L upper eyelid; gradual onset; 3 days. Mild itching. Shows me a picture of her R upper eyelid approx 6 months ago; appears to have a stye. Reports developed similarly to current.  Injury: no. Visual changes: no. Contact lens use: no. Recent illness: no. Self treatment: none.   OBJECTIVE:  Vitals:   12/13/19 1025  BP: 132/84  Pulse: 81  Resp: 18  Temp: 97.8 F (36.6 C)  TempSrc: Oral  SpO2: 97%    General appearance: alert; no distress HEENT: [place]; AT; PERRLA; no restriction of the extraocular movements OS: without reported pain; without conjunctival injection; without drainage; without corneal opacities; without limbal flush; swelling and erythema of upper lid OD: normal Neck: supple without LAD Lungs: clear to auscultation bilaterally; unlabored respirations Heart: regular rate and rhythm Skin: warm and dry Psychological: alert and cooperative; normal mood and affect  No Known Allergies  History reviewed. No pertinent past  medical history.   Social History   Socioeconomic History   Marital status: Married    Spouse name: Not on file   Number of children: Not on file   Years of education: Not on file   Highest education level: Not on file  Occupational History   Not on file  Tobacco Use   Smoking status: Never Smoker   Smokeless tobacco: Never Used  Substance and Sexual Activity   Alcohol use: Never   Drug use: Never   Sexual activity: Yes  Other Topics Concern   Not on file  Social History Narrative   Not on file   Social Determinants of Health   Financial Resource Strain:    Difficulty of Paying Living Expenses:   Food Insecurity:    Worried About Programme researcher, broadcasting/film/video in the Last Year:    Barista in the Last Year:   Transportation Needs:    Freight forwarder (Medical):    Lack of Transportation (Non-Medical):   Physical Activity:    Days of Exercise per Week:    Minutes of Exercise per Session:   Stress:    Feeling of Stress :   Social Connections:    Frequency of Communication with Friends and Family:    Frequency of Social Gatherings with Friends and Family:    Attends Religious Services:    Active Member of Clubs or Organizations:    Attends Banker Meetings:    Marital Status:   Intimate Partner Violence:  Fear of Current or Ex-Partner:    Emotionally Abused:    Physically Abused:    Sexually Abused:    Family History  Family history unknown: Yes   History reviewed. No pertinent surgical history.   Vanessa Kick, MD 12/13/19 1112

## 2019-12-13 NOTE — ED Triage Notes (Signed)
Pt presents with left eye swelling, itching, and pain since Monday.

## 2019-12-22 ENCOUNTER — Other Ambulatory Visit: Payer: Self-pay

## 2019-12-22 ENCOUNTER — Ambulatory Visit (HOSPITAL_COMMUNITY)
Admission: EM | Admit: 2019-12-22 | Discharge: 2019-12-22 | Disposition: A | Discharge status: Home / Self Care | Payer: Medicaid Other | Attending: Emergency Medicine | Admitting: Emergency Medicine

## 2019-12-22 ENCOUNTER — Encounter (HOSPITAL_COMMUNITY): Payer: Self-pay

## 2019-12-22 DIAGNOSIS — H00014 Hordeolum externum left upper eyelid: Secondary | ICD-10-CM

## 2019-12-22 MED ORDER — CEPHALEXIN 500 MG PO CAPS: 500.0000 mg | ORAL_CAPSULE | Freq: Four times a day (QID) | ORAL | 0 refills | Status: AC

## 2019-12-22 NOTE — ED Triage Notes (Signed)
Pt c/o swelling/redness to upper eyelid of left eye for approx 10 days. Denies fever, n/v/d.  Pt states it has made her vision blurry.

## 2019-12-22 NOTE — Discharge Instructions (Addendum)
Stye  -Warm compresses multiple times a day with massage afterward  - Do not wear contacts or make up  - Keep lid clean, may use baby shampoo diluted with water - Keflex for the next 5 days - Return if increasing, redness, pain or swelling, decreased vision.

## 2019-12-22 NOTE — ED Provider Notes (Signed)
[place]    CSN: [id number] Arrival date & time: 12/22/19  1159      History   Chief Complaint Chief Complaint  Patient presents with  . Eye Problem    HPI Mary Myers is a 29 y.o. female presenting today for evaluation of eyelid swelling and pain.  Patient reports over the past 10 days she has had swelling and redness to her left upper lid.  She reports some occasional blurring, but relates this to the swelling rather than true change in vision.  She reports some occasional drainage.  She endorses a lot of itching associated with symptoms.  Patient was seen here last week and was treated with tobramycin drops and prednisone course.  Feels the drops improve the itching and foreign body sensation, but symptoms have persisted.  Denies contact use.  HPI  History reviewed. No pertinent past medical history.  Patient Active Problem List   Diagnosis Date Noted  . Post-dates pregnancy 07/17/2019  . Pregnancy 07/17/2019    History reviewed. No pertinent surgical history.  OB History    Gravida  1   Para  1   Term  1   Preterm      AB      Living  1     SAB      TAB      Ectopic      Multiple  0   Live Births  1            Home Medications    Prior to Admission medications   Medication Sig Start Date End Date Taking? Authorizing Provider  tobramycin (TOBREX) 0.3 % ophthalmic solution Place 1 drop into the right eye every 8 (eight) hours. For up to one week. 12/13/19  Yes Hagler, Aaron Edelman, MD  acetaminophen (TYLENOL) 500 MG tablet Take 500 mg by mouth every 6 (six) hours as needed.    [provider]  cephALEXin (KEFLEX) 500 MG capsule Take 1 capsule (500 mg total) by mouth 4 (four) times daily for 5 days. 12/22/19 12/27/19  Jaeliana Lococo C, PA-C  ibuprofen (ADVIL) 600 MG tablet Take 1 tablet (600 mg total) by mouth every 6 (six) hours as needed. 08/16/19   Orvan July, NP    Family History Family History  Family history unknown: Yes     Social History Social History   Tobacco Use  . Smoking status: Never Smoker  . Smokeless tobacco: Never Used  Substance Use Topics  . Alcohol use: Never  . Drug use: Never     Allergies   Patient has no known allergies.   Review of Systems Review of Systems  Constitutional: Negative for activity change, appetite change, chills, fatigue and fever.  HENT: Negative for congestion, ear pain, rhinorrhea, sinus pressure, sore throat and trouble swallowing.   Eyes: Positive for redness and itching. Negative for discharge and visual disturbance.  Respiratory: Negative for cough, chest tightness and shortness of breath.   Cardiovascular: Negative for chest pain.  Gastrointestinal: Negative for abdominal pain, diarrhea, nausea and vomiting.  Musculoskeletal: Negative for myalgias.  Skin: Negative for rash.  Neurological: Negative for dizziness, light-headedness and headaches.     Physical Exam Triage Vital Signs ED Triage Vitals  Enc Vitals Group     BP 12/22/19 1307 121/83     Pulse Rate 12/22/19 1307 66     Resp 12/22/19 1307 18     Temp 12/22/19 1307 98.3 F (36.8 C)     Temp  Source 12/22/19 1307 Oral     SpO2 12/22/19 1307 100 %     Weight --      Height --      Head Circumference --      Peak Flow --      Pain Score 12/22/19 1309 6     Pain Loc --      Pain Edu? --      Excl. in GC? --    No data found.  Updated Vital Signs BP 121/83 (BP Location: Right Arm)   Pulse 66   Temp 98.3 F (36.8 C) (Oral)   Resp 18   SpO2 100%   Visual Acuity Right Eye Distance: 20/20 Left Eye Distance: 20/40 Bilateral Distance: 20/20  Right Eye Near:   Left Eye Near:    Bilateral Near:     Physical Exam Vitals and nursing note reviewed.  Constitutional:      Appearance: She is well-developed.     Comments: No acute distress  HENT:     Head: Normocephalic and atraumatic.     Ears:     Comments: Bilateral ears without tenderness to palpation of external auricle,  tragus and mastoid, EAC's without erythema or swelling, TM's with good bony landmarks and cone of light. Non erythematous.     Nose: Nose normal.  Eyes:     Extraocular Movements: Extraocular movements intact.     Conjunctiva/sclera: Conjunctivae normal.     Pupils: Pupils are equal, round, and reactive to light.     Comments: Bilateral eyes white, mild conjunctival erythema noted within inner lid of left eye, upper lid with 1 cm x 1 cm area of swelling erythema and slight fluctuance, tender to palpation  Cardiovascular:     Rate and Rhythm: Normal rate.  Pulmonary:     Effort: Pulmonary effort is normal. No respiratory distress.  Abdominal:     General: There is no distension.  Musculoskeletal:        General: Normal range of motion.     Cervical back: Neck supple.  Skin:    General: Skin is warm and dry.  Neurological:     Mental Status: She is alert and oriented to person, place, and time.      UC Treatments / Results  Labs (all labs ordered are listed, but only abnormal results are displayed) Labs Reviewed - No data to display  EKG   Radiology No results found.  Procedures Procedures (including critical care time)  Medications Ordered in UC Medications - No data to display  Initial Impression / Assessment and Plan / UC Course  I have reviewed the triage vital signs and the nursing notes.  Pertinent labs & imaging results that were available during my care of the patient were reviewed by me and considered in my medical decision making (see chart for details).     Exam suggestive of hordeolum, has been using topical antibiotic drops without relief, will switch him to Keflex, recommending warm compresses, gentle lid scrubs and monitoring for gradual improvement.  No signs of ocular involvement at this time.  Discussed strict return precautions. Patient verbalized understanding and is agreeable with plan.  Final Clinical Impressions(s) / UC Diagnoses   Final  diagnoses:  Hordeolum externum left upper eyelid     Discharge Instructions     Stye  -Warm compresses multiple times a day with massage afterward  - Do not wear contacts or make up  - Keep lid clean, may use baby shampoo diluted  with water - Keflex for the next 5 days - Return if increasing, redness, pain or swelling, decreased vision.    ED Prescriptions    Medication Sig Dispense Auth. Provider   cephALEXin (KEFLEX) 500 MG capsule Take 1 capsule (500 mg total) by mouth 4 (four) times daily for 5 days. 20 capsule Shanyce Daris, [place] C, PA-C     PDMP not reviewed this encounter.   Lew Dawes, PA-C 12/22/19 1442

## 2020-06-07 ENCOUNTER — Ambulatory Visit (HOSPITAL_COMMUNITY)
Admission: EM | Admit: 2020-06-07 | Discharge: 2020-06-07 | Disposition: A | Discharge status: Home / Self Care | Payer: Medicaid Other

## 2020-06-07 ENCOUNTER — Encounter (HOSPITAL_COMMUNITY): Payer: Self-pay

## 2020-06-07 ENCOUNTER — Other Ambulatory Visit: Payer: Self-pay

## 2020-06-07 DIAGNOSIS — M7661 Achilles tendinitis, right leg: Secondary | ICD-10-CM | POA: Diagnosis not present

## 2020-06-07 NOTE — ED Triage Notes (Signed)
Pt states she injured her right ankle about 5 years ago while wearing high heels and has had problems since.Pt states her ankle started hurting again about 3 days ago. Pt is aox4 and ambulates with a limp.

## 2020-06-07 NOTE — Discharge Instructions (Signed)
Recommend applying ice to affected area Take tylenol as needed Rest  Follow up with orthopedics if needed

## 2020-06-07 NOTE — ED Provider Notes (Signed)
[facility]    CSN: [id number] Arrival date & time: 06/07/20  1448      History   Chief Complaint Chief Complaint  Patient presents with  . Ankle Pain    right x 3 days    HPI Mary Myers is a 29 y.o. female.   Pt complains of right ankle pain that started about three days ago.  Denies injury or trauma.  She reports about 5 years ago she hurt this same ankle, rolled the ankle while wearing high heels.  She reports increased pain with ambulation.  She has taken tylenol with temporary relief.  She is currently pregnant.      History reviewed. No pertinent past medical history.  Patient Active Problem List   Diagnosis Date Noted  . Post-dates pregnancy 07/17/2019  . Pregnancy 07/17/2019    History reviewed. No pertinent surgical history.  OB History    Gravida  2   Para  1   Term  1   Preterm      AB      Living  1     SAB      TAB      Ectopic      Multiple  0   Live Births  1            Home Medications    Prior to Admission medications   Medication Sig Start Date End Date Taking? Authorizing Provider  acetaminophen (TYLENOL) 500 MG tablet Take 500 mg by mouth every 6 (six) hours as needed.   Yes [provider]  ibuprofen (ADVIL) 600 MG tablet Take 1 tablet (600 mg total) by mouth every 6 (six) hours as needed. 08/16/19  Yes Bast, Traci A, NP  tobramycin (TOBREX) 0.3 % ophthalmic solution Place 1 drop into the right eye every 8 (eight) hours. For up to one week. 12/13/19   Mardella Layman, MD    Family History Family History  Family history unknown: Yes    Social History Social History   Tobacco Use  . Smoking status: Never Smoker  . Smokeless tobacco: Never Used  Vaping Use  . Vaping Use: Never used  Substance Use Topics  . Alcohol use: Never  . Drug use: Never     Allergies   Patient has no known allergies.   Review of Systems Review of Systems  Constitutional: Negative for chills and fever.  HENT:  Negative for ear pain and sore throat.   Eyes: Negative for pain and visual disturbance.  Respiratory: Negative for cough and shortness of breath.   Cardiovascular: Negative for chest pain and palpitations.  Gastrointestinal: Negative for abdominal pain and vomiting.  Genitourinary: Negative for dysuria and hematuria.  Musculoskeletal: Positive for arthralgias (right ankle pain). Negative for back pain.  Skin: Negative for color change and rash.  Neurological: Negative for seizures and syncope.  All other systems reviewed and are negative.    Physical Exam Triage Vital Signs ED Triage Vitals  Enc Vitals Group     BP 06/07/20 0946 123/68     Pulse Rate 06/07/20 0946 79     Resp 06/07/20 0946 17     Temp 06/07/20 0946 98.4 F (36.9 C)     Temp Source 06/07/20 0946 Oral     SpO2 06/07/20 0946 100 %     Weight --      Height --      Head Circumference --      Peak Flow --  Pain Score 06/07/20 0947 5     Pain Loc --      Pain Edu? --      Excl. in GC? --    No data found.  Updated Vital Signs BP 123/68 (BP Location: Right Arm)   Pulse 79   Temp 98.4 F (36.9 C) (Oral)   Resp 17   LMP  (LMP Unknown)   SpO2 100%   Visual Acuity Right Eye Distance:   Left Eye Distance:   Bilateral Distance:    Right Eye Near:   Left Eye Near:    Bilateral Near:     Physical Exam Vitals and nursing note reviewed.  Constitutional:      General: She is not in acute distress.    Appearance: She is well-developed.  HENT:     Head: Normocephalic and atraumatic.  Eyes:     Conjunctiva/sclera: Conjunctivae normal.  Cardiovascular:     Rate and Rhythm: Normal rate and regular rhythm.     Heart sounds: No murmur heard.   Pulmonary:     Effort: Pulmonary effort is normal. No respiratory distress.     Breath sounds: Normal breath sounds.  Abdominal:     Palpations: Abdomen is soft.     Tenderness: There is no abdominal tenderness.  Musculoskeletal:     Cervical back: Neck  supple.     Right ankle: No swelling, deformity or ecchymosis. No tenderness. Normal range of motion.     Right Achilles Tendon: Tenderness present. No defects. Thompson's test negative.     Left ankle: Normal.  Skin:    General: Skin is warm and dry.  Neurological:     Mental Status: She is alert.      UC Treatments / Results  Labs (all labs ordered are listed, but only abnormal results are displayed) Labs Reviewed - No data to display  EKG   Radiology No results found.  Procedures Procedures (including critical care time)  Medications Ordered in UC Medications - No data to display  Initial Impression / Assessment and Plan / UC Course  I have reviewed the triage vital signs and the nursing notes.  Pertinent labs & imaging results that were available during my care of the patient were reviewed by me and considered in my medical decision making (see chart for details).     Achilles tendonitis. Advised ice and tylenol.  No ibuprofen due to pregnancy.  If no improvement with conservative tx can follow up with orthopedics.  Final Clinical Impressions(s) / UC Diagnoses   Final diagnoses:  None   Discharge Instructions   None    ED Prescriptions    None     PDMP not reviewed this encounter.   Jodell Cipro, PA-C 06/07/20 1059

## 2020-07-13 NOTE — L&D Delivery Note (Signed)
Delivery Note Patient progressed along a normal labor curve.  She pushed for < 10 minutes.  At 12:40 PM a viable female was delivered via Vaginal, Spontaneous (Presentation:  ROA  ).  APGAR: 8, 9; weight 7 lb 2.3 oz (3240 g).   There was a tight nuchal cord x 1 that was unable to be reduced prior to delivery  Following delivery, there was rapid and brisk uterine bleeding.  Pitocin bolus was started and 1 gm of tranexamic acid given immediately.  With delivery of the placenta and aggressive fundal massage, bleeding rapidly slowed.  The fundus remained firm throughout postpartum recovery. The initial brisk bleeding may have been mixed with amniotic fluid, but it was difficult to discern, so total EBL includes all blood and fluid loss following delivery.    Placenta status: Spontaneous, Intact.  Cord: 3 vessels with the following complications: None.  Cord pH: n/a  Anesthesia: Local Episiotomy: None Lacerations: Labial, right, hemostatic Suture Repair:  n/a Est. Blood Loss (mL): 881  Mom to postpartum.  Baby to Couplet care / Skin to Skin.  Nemiah Bubar GEFFEL Pieper Kasik 01/15/2021, 2:28 PM

## 2020-08-12 ENCOUNTER — Other Ambulatory Visit: Payer: Self-pay | Admitting: Obstetrics and Gynecology

## 2020-08-12 DIAGNOSIS — Z363 Encounter for antenatal screening for malformations: Secondary | ICD-10-CM

## 2020-09-03 ENCOUNTER — Other Ambulatory Visit: Payer: Self-pay | Admitting: Obstetrics and Gynecology

## 2020-09-03 ENCOUNTER — Ambulatory Visit: Payer: Medicaid Other | Attending: Obstetrics and Gynecology

## 2020-09-03 ENCOUNTER — Other Ambulatory Visit: Payer: Self-pay

## 2020-09-03 ENCOUNTER — Other Ambulatory Visit: Payer: Self-pay | Admitting: *Deleted

## 2020-09-03 DIAGNOSIS — Z363 Encounter for antenatal screening for malformations: Secondary | ICD-10-CM

## 2020-09-03 DIAGNOSIS — O4402 Placenta previa specified as without hemorrhage, second trimester: Secondary | ICD-10-CM

## 2020-10-01 ENCOUNTER — Encounter: Payer: Self-pay | Admitting: *Deleted

## 2020-10-04 ENCOUNTER — Ambulatory Visit: Payer: Medicaid Other | Attending: Obstetrics

## 2020-10-04 ENCOUNTER — Ambulatory Visit: Payer: Medicaid Other | Admitting: *Deleted

## 2020-10-04 ENCOUNTER — Encounter: Payer: Self-pay | Admitting: *Deleted

## 2020-10-04 ENCOUNTER — Other Ambulatory Visit: Payer: Self-pay

## 2020-10-04 VITALS — BP 107/75 | HR 86

## 2020-10-04 DIAGNOSIS — O283 Abnormal ultrasonic finding on antenatal screening of mother: Secondary | ICD-10-CM | POA: Insufficient documentation

## 2020-10-04 DIAGNOSIS — Z3A23 23 weeks gestation of pregnancy: Secondary | ICD-10-CM | POA: Diagnosis not present

## 2020-10-04 DIAGNOSIS — O4402 Placenta previa specified as without hemorrhage, second trimester: Secondary | ICD-10-CM | POA: Insufficient documentation

## 2020-10-04 DIAGNOSIS — O321XX0 Maternal care for breech presentation, not applicable or unspecified: Secondary | ICD-10-CM | POA: Diagnosis not present

## 2020-10-04 DIAGNOSIS — O358XX0 Maternal care for other (suspected) fetal abnormality and damage, not applicable or unspecified: Secondary | ICD-10-CM | POA: Diagnosis not present

## 2021-01-07 ENCOUNTER — Encounter (HOSPITAL_COMMUNITY): Payer: Self-pay | Admitting: Obstetrics and Gynecology

## 2021-01-07 ENCOUNTER — Other Ambulatory Visit: Payer: Self-pay

## 2021-01-07 ENCOUNTER — Inpatient Hospital Stay (HOSPITAL_COMMUNITY)
Admission: AD | Admit: 2021-01-07 | Discharge: 2021-01-07 | Disposition: A | Discharge status: Home / Self Care | Payer: Medicaid Other | Attending: Obstetrics and Gynecology | Admitting: Obstetrics and Gynecology

## 2021-01-07 DIAGNOSIS — O26893 Other specified pregnancy related conditions, third trimester: Secondary | ICD-10-CM | POA: Diagnosis present

## 2021-01-07 DIAGNOSIS — Z3689 Encounter for other specified antenatal screening: Secondary | ICD-10-CM | POA: Diagnosis not present

## 2021-01-07 DIAGNOSIS — K529 Noninfective gastroenteritis and colitis, unspecified: Secondary | ICD-10-CM | POA: Diagnosis not present

## 2021-01-07 DIAGNOSIS — Z79899 Other long term (current) drug therapy: Secondary | ICD-10-CM | POA: Diagnosis not present

## 2021-01-07 DIAGNOSIS — O218 Other vomiting complicating pregnancy: Secondary | ICD-10-CM | POA: Insufficient documentation

## 2021-01-07 DIAGNOSIS — O99613 Diseases of the digestive system complicating pregnancy, third trimester: Secondary | ICD-10-CM | POA: Diagnosis not present

## 2021-01-07 DIAGNOSIS — R109 Unspecified abdominal pain: Secondary | ICD-10-CM | POA: Insufficient documentation

## 2021-01-07 DIAGNOSIS — Z3A37 37 weeks gestation of pregnancy: Secondary | ICD-10-CM | POA: Diagnosis not present

## 2021-01-07 LAB — URINALYSIS, ROUTINE W REFLEX MICROSCOPIC
Bacteria, UA: NONE SEEN
Bilirubin Urine: NEGATIVE
Glucose, UA: 50 mg/dL — AB
Hgb urine dipstick: NEGATIVE
Ketones, ur: 5 mg/dL — AB
Leukocytes,Ua: NEGATIVE
Nitrite: NEGATIVE
Protein, ur: 100 mg/dL — AB
Specific Gravity, Urine: 1.023 (ref 1.005–1.030)
pH: 5 (ref 5.0–8.0)

## 2021-01-07 MED ORDER — DICYCLOMINE HCL 10 MG PO CAPS
10.0000 mg | ORAL_CAPSULE | Freq: Once | ORAL | Status: AC
  Administered 2021-01-07: 10 mg via ORAL
  Filled 2021-01-07: qty 1

## 2021-01-07 MED ORDER — ONDANSETRON 4 MG PO TBDP: 4.0000 mg | ORAL_TABLET | Freq: Three times a day (TID) | ORAL | 0 refills | Status: DC | PRN

## 2021-01-07 MED ORDER — LACTATED RINGERS IV BOLUS
1000.0000 mL | Freq: Once | INTRAVENOUS | Status: AC
  Administered 2021-01-07: 03:00:00 1000 mL via INTRAVENOUS

## 2021-01-07 MED ORDER — ONDANSETRON HCL 4 MG/2ML IJ SOLN
4.0000 mg | Freq: Once | INTRAMUSCULAR | Status: AC
  Administered 2021-01-07: 03:00:00 4 mg via INTRAVENOUS
  Filled 2021-01-07: qty 2

## 2021-01-07 MED ORDER — FAMOTIDINE IN NACL 20-0.9 MG/50ML-% IV SOLN
20.0000 mg | Freq: Once | INTRAVENOUS | Status: AC
  Administered 2021-01-07: 03:00:00 20 mg via INTRAVENOUS
  Filled 2021-01-07: qty 50

## 2021-01-07 NOTE — MAU Provider Note (Signed)
Chief Complaint:  Emesis, Diarrhea, and Abdominal Pain   Event Date/Time   First Provider Initiated Contact with Patient 01/07/21 [phone]     HPI: Mary Myers is a 30 y.o. G2P1001 at [redacted]w[redacted]d who presents to maternity admissions via EMS reporting nausea with vomiting, diarrhea and abdominal cramping since shortly after 8pm this evening. She ate dinner that consisted of silkworm soup (cultural norm) and shortly after dinner became ill, she called EMS after the abdominal cramping began. Last episode of emesis was around 1am. Feeling less nauseated, is more concerned about cramping. Denies vaginal bleeding, leaking of fluid, decreased fetal movement, fever, falls, or other recent illness.   Pregnancy Course: Receives care at [facility]  History reviewed. No pertinent past medical history. OB History  Gravida Para Term Preterm AB Living  2 1 1     1   SAB IAB Ectopic Multiple Live Births        0 1    # Outcome Date GA Lbr Len/2nd Weight Sex Delivery Anes PTL Lv  2 Current           1 Term 07/17/19 [redacted]w[redacted]d 04:44 / 01:43 8 lb 1.8 oz (3.68 kg) M Vag-Spont EPI  LIV   History reviewed. No pertinent surgical history. Family History  Family history unknown: Yes   Social History   Tobacco Use   Smoking status: Never   Smokeless tobacco: Never  Vaping Use   Vaping Use: Never used  Substance Use Topics   Alcohol use: Never   Drug use: Never   No Known Allergies Medications Prior to Admission  Medication Sig Dispense Refill Last Dose   Prenatal Vit-Fe Fumarate-FA (PRENATAL MULTIVITAMIN) TABS tablet Take 1 tablet by mouth daily at 12 noon.   Past Week   acetaminophen (TYLENOL) 500 MG tablet Take 500 mg by mouth every 6 (six) hours as needed. (Patient not taking: Reported on 10/04/2020)      ibuprofen (ADVIL) 600 MG tablet Take 1 tablet (600 mg total) by mouth every 6 (six) hours as needed. (Patient not taking: Reported on 10/04/2020) 30 tablet 0    tobramycin (TOBREX) 0.3 % ophthalmic solution  Place 1 drop into the right eye every 8 (eight) hours. For up to one week. (Patient not taking: Reported on 10/04/2020) 5 mL 0    I have reviewed patient's Past Medical Hx, Surgical Hx, Family Hx, Social Hx, medications and allergies.   ROS:  Pertinent items noted in HPI and remainder of comprehensive ROS otherwise negative.  Physical Exam  Patient Vitals for the past 24 hrs:  BP Temp Temp src Pulse Resp Height Weight  01/07/21 0221 110/75 98.5 F (36.9 C) Oral 98 16 5\' 2"  (1.575 m) 149 lb (67.6 kg)   Constitutional: Well-developed, well-nourished female in mild distress Cardiovascular: normal rate & rhythm, no murmur Respiratory: normal effort, lung sounds clear throughout GI: Abd soft, non-tender, gravid appropriate for gestational age. Pos BS x 4 MS: Extremities nontender, no edema, normal ROM Neurologic: Alert and oriented x 4.  GU: no CVA tenderness Pelvic:   Dilation: 1 Effacement (%): Thick Station: -3 Presentation: Vertex Exam by:: , RN  Fetal Tracing: reactive Baseline: 140 Variability: moderate Accelerations: 15x15 Decelerations: none Toco: irregular, q1-10   Labs: Results for orders placed or performed during the hospital encounter of 01/07/21 (from the past 24 hour(s))  Urinalysis, Routine w reflex microscopic Urine, Clean Catch     Status: Abnormal   Collection Time: 01/07/21  3:50 AM  Result  Value Ref Range   Color, Urine YELLOW YELLOW   APPearance HAZY (A) CLEAR   Specific Gravity, Urine 1.023 1.005 - 1.030   pH 5.0 5.0 - 8.0   Glucose, UA 50 (A) NEGATIVE mg/dL   Hgb urine dipstick NEGATIVE NEGATIVE   Bilirubin Urine NEGATIVE NEGATIVE   Ketones, ur 5 (A) NEGATIVE mg/dL   Protein, ur 371 (A) NEGATIVE mg/dL   Nitrite NEGATIVE NEGATIVE   Leukocytes,Ua NEGATIVE NEGATIVE   RBC / HPF 0-5 0 - 5 RBC/hpf   WBC, UA 0-5 0 - 5 WBC/hpf   Bacteria, UA NONE SEEN NONE SEEN   Squamous Epithelial / LPF 0-5 0 - 5   Mucus PRESENT     Imaging:  No  results found.  MAU Course: Orders Placed This Encounter  Procedures   Urinalysis, Routine w reflex microscopic Urine, Clean Catch   Discharge patient   Meds ordered this encounter  Medications   lactated ringers bolus 1,000 mL   ondansetron (ZOFRAN) injection 4 mg   famotidine (PEPCID) IVPB 20 mg premix   dicyclomine (BENTYL) capsule 10 mg   ondansetron (ZOFRAN ODT) 4 MG disintegrating tablet    Sig: Take 1 tablet (4 mg total) by mouth every 8 (eight) hours as needed for nausea or vomiting.    Dispense:  15 tablet    Refill:  0    Order Specific Question:   Supervising Provider    Answer:   Reva Bores [[id number]]   MDM: LR bolus (x2), IV zofran+pepcid given with relief of upper abdominal pain, nausea and vomiting. Lower-mid abdominal cramping remained so a dose of bentyl ordered with good relief of intestinal cramping. Cervix unchanged  Assessment: 1. Gastroenteritis, acute   2. NST (non-stress test) reactive   3. [redacted] weeks gestation of pregnancy     Plan: Discharge home in stable condition with term labor precautions    Follow-up Information     Ob/Gyn, Nestor Ramp. Go to.   Why: as scheduled for ongoing prenatal care Contact information: [address] [place] [address] [phone]                 Allergies as of 01/07/2021   No Known Allergies      Medication List     STOP taking these medications    acetaminophen 500 MG tablet Commonly known as: TYLENOL   ibuprofen 600 MG tablet Commonly known as: ADVIL   tobramycin 0.3 % ophthalmic solution Commonly known as: TOBREX       TAKE these medications    ondansetron 4 MG disintegrating tablet Commonly known as: Zofran ODT Take 1 tablet (4 mg total) by mouth every 8 (eight) hours as needed for nausea or vomiting.   prenatal multivitamin Tabs tablet Take 1 tablet by mouth daily at 12 noon.        Edd Arbour, CNM, MSN, IBCLC Certified Nurse Midwife, [facility]

## 2021-01-07 NOTE — MAU Note (Signed)
..  Mary Myers is a 30 y.o. at [redacted]w[redacted]d here in MAU via EMS reporting: diarrhea (5-6 times) and vomiting (5-6 times) after eating silk worms at 8pm. Epigastric pain.  Denies vaginal bleeding or leaking of fluid. +FM.

## 2021-01-15 ENCOUNTER — Inpatient Hospital Stay (HOSPITAL_COMMUNITY)
Admission: AD | Admit: 2021-01-15 | Discharge: 2021-01-17 | DRG: 807 | Disposition: A | Discharge status: Home / Self Care | Payer: Medicaid Other | Attending: Obstetrics | Admitting: Obstetrics

## 2021-01-15 ENCOUNTER — Encounter (HOSPITAL_COMMUNITY): Payer: Self-pay | Admitting: Obstetrics

## 2021-01-15 ENCOUNTER — Other Ambulatory Visit: Payer: Self-pay

## 2021-01-15 DIAGNOSIS — Z20822 Contact with and (suspected) exposure to covid-19: Secondary | ICD-10-CM | POA: Diagnosis present

## 2021-01-15 DIAGNOSIS — Z3A38 38 weeks gestation of pregnancy: Secondary | ICD-10-CM

## 2021-01-15 DIAGNOSIS — O26893 Other specified pregnancy related conditions, third trimester: Secondary | ICD-10-CM | POA: Diagnosis present

## 2021-01-15 LAB — RESP PANEL BY RT-PCR (FLU A&B, COVID) ARPGX2
Influenza A by PCR: NEGATIVE
Influenza B by PCR: NEGATIVE
SARS Coronavirus 2 by RT PCR: NEGATIVE

## 2021-01-15 LAB — CBC
HCT: 40.1 % (ref 36.0–46.0)
Hemoglobin: 12.2 g/dL (ref 12.0–15.0)
MCH: 23.7 pg — ABNORMAL LOW (ref 26.0–34.0)
MCHC: 30.4 g/dL (ref 30.0–36.0)
MCV: 78 fL — ABNORMAL LOW (ref 80.0–100.0)
Platelets: 179 10*3/uL (ref 150–400)
RBC: 5.14 MIL/uL — ABNORMAL HIGH (ref 3.87–5.11)
RDW: 14.5 % (ref 11.5–15.5)
WBC: 10.6 10*3/uL — ABNORMAL HIGH (ref 4.0–10.5)
nRBC: 0 % (ref 0.0–0.2)

## 2021-01-15 LAB — TYPE AND SCREEN
ABO/RH(D): O POS
Antibody Screen: NEGATIVE

## 2021-01-15 MED ORDER — DIBUCAINE (PERIANAL) 1 % EX OINT: 1.0000 "application " | TOPICAL_OINTMENT | CUTANEOUS | Status: DC | PRN

## 2021-01-15 MED ORDER — PHENYLEPHRINE 40 MCG/ML (10ML) SYRINGE FOR IV PUSH (FOR BLOOD PRESSURE SUPPORT): 80.0000 ug | PREFILLED_SYRINGE | INTRAVENOUS | Status: DC | PRN

## 2021-01-15 MED ORDER — BENZOCAINE-MENTHOL 20-0.5 % EX AERO
1.0000 "application " | INHALATION_SPRAY | CUTANEOUS | Status: DC | PRN
  Administered 2021-01-15: 1 via TOPICAL
  Filled 2021-01-15: qty 56

## 2021-01-15 MED ORDER — ONDANSETRON HCL 4 MG/2ML IJ SOLN: 4.0000 mg | Freq: Four times a day (QID) | INTRAMUSCULAR | Status: DC | PRN

## 2021-01-15 MED ORDER — OXYTOCIN-SODIUM CHLORIDE 30-0.9 UT/500ML-% IV SOLN
2.5000 [IU]/h | INTRAVENOUS | Status: DC
  Administered 2021-01-15: 2.5 [IU]/h via INTRAVENOUS
  Filled 2021-01-15: qty 500

## 2021-01-15 MED ORDER — ONDANSETRON HCL 4 MG PO TABS: 4.0000 mg | ORAL_TABLET | ORAL | Status: DC | PRN

## 2021-01-15 MED ORDER — ACETAMINOPHEN 325 MG PO TABS: 650.0000 mg | ORAL_TABLET | ORAL | Status: DC | PRN

## 2021-01-15 MED ORDER — DIPHENHYDRAMINE HCL 50 MG/ML IJ SOLN: 12.5000 mg | INTRAMUSCULAR | Status: DC | PRN

## 2021-01-15 MED ORDER — IBUPROFEN 600 MG PO TABS
600.0000 mg | ORAL_TABLET | Freq: Once | ORAL | Status: AC
  Administered 2021-01-15: 600 mg via ORAL
  Filled 2021-01-15: qty 1

## 2021-01-15 MED ORDER — SIMETHICONE 80 MG PO CHEW: 80.0000 mg | CHEWABLE_TABLET | ORAL | Status: DC | PRN

## 2021-01-15 MED ORDER — ACETAMINOPHEN 325 MG PO TABS
650.0000 mg | ORAL_TABLET | ORAL | Status: DC | PRN
  Administered 2021-01-15: 650 mg via ORAL
  Filled 2021-01-15: qty 2

## 2021-01-15 MED ORDER — FENTANYL CITRATE (PF) 100 MCG/2ML IJ SOLN
50.0000 ug | INTRAMUSCULAR | Status: DC | PRN
Start: 2021-01-15 — End: 2021-01-15
  Administered 2021-01-15 (×2): 100 ug via INTRAVENOUS
  Filled 2021-01-15 (×2): qty 2

## 2021-01-15 MED ORDER — PRENATAL MULTIVITAMIN CH
1.0000 | ORAL_TABLET | Freq: Every day | ORAL | Status: DC
  Administered 2021-01-16: 1 via ORAL
  Filled 2021-01-15: qty 1

## 2021-01-15 MED ORDER — TETANUS-DIPHTH-ACELL PERTUSSIS 5-2.5-18.5 LF-MCG/0.5 IM SUSY: 0.5000 mL | PREFILLED_SYRINGE | Freq: Once | INTRAMUSCULAR | Status: DC

## 2021-01-15 MED ORDER — OXYCODONE-ACETAMINOPHEN 5-325 MG PO TABS: 2.0000 | ORAL_TABLET | ORAL | Status: DC | PRN

## 2021-01-15 MED ORDER — TRANEXAMIC ACID-NACL 1000-0.7 MG/100ML-% IV SOLN: 1000.0000 mg | INTRAVENOUS | Status: DC

## 2021-01-15 MED ORDER — IBUPROFEN 600 MG PO TABS
600.0000 mg | ORAL_TABLET | Freq: Four times a day (QID) | ORAL | Status: DC
  Administered 2021-01-15 – 2021-01-17 (×7): 600 mg via ORAL
  Filled 2021-01-15 (×7): qty 1

## 2021-01-15 MED ORDER — SENNOSIDES-DOCUSATE SODIUM 8.6-50 MG PO TABS
2.0000 | ORAL_TABLET | ORAL | Status: DC
  Administered 2021-01-16: 2 via ORAL
  Filled 2021-01-15: qty 2

## 2021-01-15 MED ORDER — ONDANSETRON HCL 4 MG/2ML IJ SOLN: 4.0000 mg | INTRAMUSCULAR | Status: DC | PRN

## 2021-01-15 MED ORDER — LIDOCAINE HCL (PF) 1 % IJ SOLN: 30.0000 mL | INTRAMUSCULAR | Status: DC | PRN

## 2021-01-15 MED ORDER — DIPHENHYDRAMINE HCL 25 MG PO CAPS: 25.0000 mg | ORAL_CAPSULE | Freq: Four times a day (QID) | ORAL | Status: DC | PRN

## 2021-01-15 MED ORDER — LACTATED RINGERS IV SOLN: 500.0000 mL | Freq: Once | INTRAVENOUS | Status: DC

## 2021-01-15 MED ORDER — COCONUT OIL OIL
1.0000 "application " | TOPICAL_OIL | Status: DC | PRN
  Administered 2021-01-15: 1 via TOPICAL

## 2021-01-15 MED ORDER — LACTATED RINGERS IV SOLN: 500.0000 mL | INTRAVENOUS | Status: DC | PRN

## 2021-01-15 MED ORDER — WITCH HAZEL-GLYCERIN EX PADS: 1.0000 "application " | MEDICATED_PAD | CUTANEOUS | Status: DC | PRN

## 2021-01-15 MED ORDER — SOD CITRATE-CITRIC ACID 500-334 MG/5ML PO SOLN: 30.0000 mL | ORAL | Status: DC | PRN

## 2021-01-15 MED ORDER — OXYCODONE HCL 5 MG PO TABS: 10.0000 mg | ORAL_TABLET | ORAL | Status: DC | PRN

## 2021-01-15 MED ORDER — FENTANYL-BUPIVACAINE-NACL 0.5-0.125-0.9 MG/250ML-% EP SOLN
12.0000 mL/h | EPIDURAL | Status: DC | PRN
Start: 2021-01-15 — End: 2021-01-15

## 2021-01-15 MED ORDER — LACTATED RINGERS IV SOLN: INTRAVENOUS | Status: DC

## 2021-01-15 MED ORDER — TRANEXAMIC ACID-NACL 1000-0.7 MG/100ML-% IV SOLN
INTRAVENOUS | Status: AC
  Administered 2021-01-15: 1000 mg
  Filled 2021-01-15: qty 100

## 2021-01-15 MED ORDER — EPHEDRINE 5 MG/ML INJ: 10.0000 mg | INTRAVENOUS | Status: DC | PRN

## 2021-01-15 MED ORDER — OXYCODONE HCL 5 MG PO TABS: 5.0000 mg | ORAL_TABLET | ORAL | Status: DC | PRN

## 2021-01-15 MED ORDER — OXYCODONE-ACETAMINOPHEN 5-325 MG PO TABS: 1.0000 | ORAL_TABLET | ORAL | Status: DC | PRN

## 2021-01-15 MED ORDER — OXYTOCIN BOLUS FROM INFUSION
333.0000 mL | Freq: Once | INTRAVENOUS | Status: AC
  Administered 2021-01-15: 333 mL via INTRAVENOUS

## 2021-01-15 NOTE — MAU Note (Signed)
Pt started contracting at 0200, q3-5.  Has had bloody mucous. Was 3 cm last time checked.

## 2021-01-15 NOTE — Lactation Note (Signed)
This note was copied from a baby's chart. Lactation Consultation Note  Patient Name: Boy Abcde Koffler Today's Date: 01/15/2021 Reason for consult: L&D Initial assessment;Early term 37-38.6wks Age:29 hours P2 Mother reports that she breastfed her first child for 5-6 months . Mother is [place]. Interpreter used [employer] ID # [id number]. All teaching done. Parents very happy to have another son. Mother taught hand expression. Infant latched on quickly.  Infant sustained latch for 10 mins on each breast. Encouraged frequent STS. Informed family that they would have assistance with latch and positioning when they are in there room.   Maternal Data Has patient been taught Hand Expression?: Yes Does the patient have breastfeeding experience prior to this delivery?: Yes How long did the patient breastfeed?: 5-6 months  Feeding Mother's Current Feeding Choice: Breast Milk  LATCH Score Latch: Grasps breast easily, tongue down, lips flanged, rhythmical sucking.  Audible Swallowing: Spontaneous and intermittent  Type of Nipple: Everted at rest and after stimulation  Comfort (Breast/Nipple): Soft / non-tender  Hold (Positioning): Assistance needed to correctly position infant at breast and maintain latch.  LATCH Score: 9   Lactation Tools Discussed/Used    Interventions    Discharge    Consult Status Consult Status: Follow-up Date: 01/15/21 Follow-up type: In-patient    Stevan Born [employer] [facility] [employer] 01/15/2021, 2:26 PM

## 2021-01-15 NOTE — Lactation Note (Signed)
This note was copied from a baby's chart. Lactation Consultation Note  Patient Name: Mary Myers Today's Date: 01/15/2021 Reason for consult: Initial assessment;Early term 37-38.6wks Age:30 years Consult was done thru vietnamese interpreter:  Initial visit to 9 hours old infant of a P2 mother. Mother is attempting latch upon arrival. Infant seems sleepy and uninterested. Several attempts to latch unsuccessful. Spoonfed 3 mL of colostrum.  LC talked about formula, explained paced bottlefeeding, upright position and frequent burping. Discussed normal newborn behavior, milk coming in to volume and expected I&O's.  Feeding plan:  1-Skin to skin 2-Aim for a deep, comfortable latch 3-Breastfeeding on demand or 8-12 times in 24h period. 4-Keep infant awake during breastfeeding session: massaging breast, infant's hand/shoulder/feet 5-Hand-express and offer EBM prior to supplementation. 6-If needed, supplement following guidelines, paced bottle feeding and fullness cues.  7-Monitor voids and stools as signs good intake.  8-Encouraged maternal rest, hydration and food intake.  9-Contact LC as needed for feeds/support/concerns/questions    All questions answered at this time. Provided Lactation services brochure and promoted [employer] booklet information.    Maternal Data Has patient been taught Hand Expression?: Yes Does the patient have breastfeeding experience prior to this delivery?: Yes How long did the patient breastfeed?: 3-4 months  Feeding Mother's Current Feeding Choice: Breast Milk and Formula  LATCH Score Latch: Too sleepy or reluctant, no latch achieved, no sucking elicited.  Audible Swallowing: None  Type of Nipple: Everted at rest and after stimulation  Comfort (Breast/Nipple): Soft / non-tender  Hold (Positioning): Assistance needed to correctly position infant at breast and maintain latch.  LATCH Score: 5  Interventions Interventions: Breast feeding basics  reviewed;Assisted with latch;Skin to skin;Breast massage;Hand express;Adjust position;Support pillows;Position options;Expressed milk;Education  Discharge Pump: Personal WIC Program: Yes  Consult Status Consult Status: Follow-up Date: 01/16/21 Follow-up type: In-patient    Judit Awad A Higuera Ancidey 01/15/2021, 10:06 PM

## 2021-01-15 NOTE — H&P (Signed)
30 y.o. G2P1001 @ [redacted]w[redacted]d presents with labor.  Contractions started at 0200 and are q3 to 5 minutes.  Otherwise has good fetal movement and no bleeding.  A placenta previa was noted at anatomy scan, but resolved with follow up ultrasound at 24 weeks.  Pregnancy has otherwise been uncomplicated.   History reviewed. No pertinent past medical history. History reviewed. No pertinent surgical history.    OB History  Gravida Para Term Preterm AB Living  2 1 1     1   SAB IAB Ectopic Multiple Live Births        0 1    # Outcome Date GA Lbr Len/2nd Weight Sex Delivery Anes PTL Lv  2 Current           1 Term 07/17/19 [redacted]w[redacted]d 04:44 / 01:43 3680 g M Vag-Spont EPI  LIV    Social History   Socioeconomic History   Marital status: Married    Spouse name: Not on file   Number of children: Not on file   Years of education: Not on file   Highest education level: Not on file  Occupational History   Not on file  Tobacco Use   Smoking status: Never   Smokeless tobacco: Never  Vaping Use   Vaping Use: Never used  Substance and Sexual Activity   Alcohol use: Never   Drug use: Never   Sexual activity: Yes  Other Topics Concern   Not on file  Social History Narrative   Not on file   Social Determinants of Health   Financial Resource Strain: Not on file  Food Insecurity: Not on file  Transportation Needs: Not on file  Physical Activity: Not on file  Stress: Not on file  Social Connections: Not on file  Intimate Partner Violence: Not on file   Patient has no known allergies.    Prenatal Transfer Tool  Maternal Diabetes: No Genetic Screening: Normal Maternal Ultrasounds/Referrals: Isolated EIF (echogenic intracardiac focus) Fetal Ultrasounds or other Referrals:  Referred to Materal Fetal Medicine  for anatomy scan Maternal Substance Abuse:  No Significant Maternal Medications:  None Significant Maternal Lab Results: Group B Strep negative  ABO, Rh: --/--/PENDING (07/06 [birth year]01-1993) Antibody:  PENDING (07/06 0810) Rubella:   RPR:    HBsAg:    HIV:    GBS:      Vitals:   01/15/21 0820 01/15/21 0835  BP:  117/66  Pulse:  69  Resp:  16  Temp:  98.4 F (36.9 C)  SpO2: 98%      General:  NAD Abdomen:  soft, gravid, EFW 7-7.5# Ex:  no edema SVE:  6/80/-2 FHTs:  130s, moderate variability, category 1 Toco:  q3-5 minutes   A/P   30 y.o. G2P1001 [redacted]w[redacted]d presents with labor Admit to L&D Epidural upon request--declines epidural / IV pain meds at this time Desires to defer AROM until husband arrives  FSR/ vtx/ GBS negative  Discussed plan of care with patient via [redacted]w[redacted]d interpreter [place] (number documented in chart by RN)  Feliz Beam GEFFEL Carmelina Dane

## 2021-01-16 LAB — CBC
HCT: 33.1 % — ABNORMAL LOW (ref 36.0–46.0)
Hemoglobin: 10.7 g/dL — ABNORMAL LOW (ref 12.0–15.0)
MCH: 24.4 pg — ABNORMAL LOW (ref 26.0–34.0)
MCHC: 32.3 g/dL (ref 30.0–36.0)
MCV: 75.6 fL — ABNORMAL LOW (ref 80.0–100.0)
Platelets: 174 10*3/uL (ref 150–400)
RBC: 4.38 MIL/uL (ref 3.87–5.11)
RDW: 14.3 % (ref 11.5–15.5)
WBC: 10.3 10*3/uL (ref 4.0–10.5)
nRBC: 0 % (ref 0.0–0.2)

## 2021-01-16 LAB — RPR: RPR Ser Ql: NONREACTIVE

## 2021-01-16 NOTE — Progress Notes (Signed)
Post Partum Day 1 Subjective: no complaints, up ad lib, voiding, and tolerating PO  Objective: Blood pressure 99/66, pulse 69, temperature 98.2 F (36.8 C), temperature source Oral, resp. rate 17, height 5\' 2"  (1.575 m), weight 67 kg, last menstrual period 04/21/2020, SpO2 97 %, unknown if currently breastfeeding.  Physical Exam:  General: alert, cooperative, and appears stated age Lochia: appropriate Uterine Fundus: firm DVT Evaluation: No evidence of DVT seen on physical exam.  Recent Labs    01/15/21 0800 01/16/21 0508  HGB 12.2 10.7*  HCT 40.1 33.1*    Assessment/Plan: Plan for discharge tomorrow   LOS: 1 day   03/19/21 01/16/2021, 11:31 AM

## 2021-01-17 LAB — BIRTH TISSUE RECOVERY COLLECTION (PLACENTA DONATION)

## 2021-01-17 MED ORDER — IBUPROFEN 200 MG PO TABS: 600.0000 mg | ORAL_TABLET | Freq: Four times a day (QID) | ORAL | Status: DC | PRN

## 2021-01-17 MED ORDER — ACETAMINOPHEN 325 MG PO TABS
650.0000 mg | ORAL_TABLET | Freq: Four times a day (QID) | ORAL | Status: DC | PRN
Start: 2021-01-17 — End: 2022-12-11

## 2021-01-17 NOTE — Progress Notes (Signed)
Post Partum Day 2 Subjective: Mary Myers is doing well this morning without complaints. She is ambulating, voiding, tolerating PO. Pain is controlled. Lochia is minimal. Eager for discharge  Objective: Patient Vitals for the past 24 hrs:  BP Temp Temp src Pulse Resp SpO2  01/17/21 0500 98/67 98.3 F (36.8 C) Oral 67 18 98 %    Physical Exam:  General: alert, cooperative, and no distress Lochia: appropriate Uterine Fundus: firm DVT Evaluation: No evidence of DVT seen on physical exam.  Recent Labs    01/15/21 0800 01/16/21 0508  WBC 10.6* 10.3  HGB 12.2 10.7*  HCT 40.1 33.1*  PLT 179 174    No results for input(s): NA, K, CL, CO2CT, BUN, CREATININE, GLUCOSE, BILITOT, ALT, AST, ALKPHOS, PROT, ALBUMIN in the last 72 hours.  No results for input(s): CALCIUM, MG, PHOS in the last 72 hours.  No results for input(s): PROTIME, APTT, INR in the last 72 hours.  No results for input(s): PROTIME, APTT, INR, FIBRINOGEN in the last 72 hours. Assessment/Plan:  Mary Myers 30 y.o. G2P2002 PPD#2 sp SVD 1. PPC: continue routine postpartum care 2. Rh pos, rubella immune. S/p Tdap and COVID vaccines prenatally 3. Dispo: plan for discharge home. Discharge instructions reviewed.     LOS: 2 days   Charlett Nose 01/17/2021, 9:58 AM

## 2021-01-17 NOTE — Discharge Summary (Signed)
Postpartum Discharge Summary  Date of Service updated 01/17/21     Patient Name: Mary Myers DOB: 27-Aug-1990 MRN: [id number]  Date of admission: 01/15/2021 Delivery date:01/15/2021  Delivering provider: Jerelyn Charles  Date of discharge: 01/17/2021  Admitting diagnosis: Normal labor [O80, Z37.9] Intrauterine pregnancy: [redacted]w[redacted]d    Secondary diagnosis:  Active Problems:   Normal labor  Additional problems: none    Discharge diagnosis: Term Pregnancy Delivered                                              Post partum procedures: none Augmentation: AROM Complications: None  Hospital course: Onset of Labor With Vaginal Delivery      30y.o. yo [id number] 340w3das admitted in Active Labor on 01/15/2021. Patient had an uncomplicated labor course as follows:  Membrane Rupture Time/Date: 11:42 AM ,01/15/2021   Delivery Method:Vaginal, Spontaneous  Episiotomy: None  Lacerations:  Labial  Patient had an uncomplicated postpartum course.  She is ambulating, tolerating a regular diet, passing flatus, and urinating well. Patient is discharged home in stable condition on 01/17/21.  Newborn Data: Birth date:01/15/2021  Birth time:12:40 PM  Gender:Female  Living status:Living  Apgars:8 ,9  Weight:3240 g   Magnesium Sulfate received: No BMZ received: No Rhophylac:N/A MMR:N/A T-DaP:Given prenatally Flu: No Transfusion:No  Physical exam  Vitals:   01/15/21 2015 01/16/21 0015 01/16/21 0430 01/17/21 0500  BP: 105/69 99/65 99/66  98/67  Pulse: 72 72 69 67  Resp: 17 16 17 18   Temp: 98.4 F (36.9 C) 99.2 F (37.3 C) 98.2 F (36.8 C) 98.3 F (36.8 C)  TempSrc: Oral Oral Oral Oral  SpO2: 98% 97% 97% 98%  Weight:      Height:       General: alert, cooperative, and no distress Lochia: appropriate Uterine Fundus: firm Incision: N/A DVT Evaluation: No evidence of DVT seen on physical exam. Labs: Lab Results  Component Value Date   WBC 10.3 01/16/2021   HGB 10.7 (L) 01/16/2021   HCT 33.1 (L)  01/16/2021   MCV 75.6 (L) 01/16/2021   PLT 174 01/16/2021   No flowsheet data found. Edinburgh Score: Edinburgh Postnatal Depression Scale Screening Tool 01/15/2021  I have been able to laugh and see the funny side of things. 0  I have looked forward with enjoyment to things. 0  I have blamed myself unnecessarily when things went wrong. 1  I have been anxious or worried for no good reason. 2  I have felt scared or panicky for no good reason. 1  Things have been getting on top of me. 0  I have been so unhappy that I have had difficulty sleeping. 2  I have felt sad or miserable. 1  I have been so unhappy that I have been crying. 1  The thought of harming myself has occurred to me. 0  Edinburgh Postnatal Depression Scale Total 8      After visit meds:  Allergies as of 01/17/2021   No Known Allergies      Medication List     STOP taking these medications    ondansetron 4 MG disintegrating tablet Commonly known as: Zofran ODT       TAKE these medications    acetaminophen 325 MG tablet Commonly known as: Tylenol Take 2 tablets (650 mg total) by mouth every 6 (six) hours as needed (  for pain scale < 4).   ibuprofen 200 MG tablet Commonly known as: ADVIL Take 3 tablets (600 mg total) by mouth every 6 (six) hours as needed.   prenatal multivitamin Tabs tablet Take 1 tablet by mouth daily at 12 noon.         Discharge home in stable condition Infant Feeding: Breast Infant Disposition:home with mother Discharge instruction: per After Visit Summary and Postpartum booklet. Activity: Advance as tolerated. Pelvic rest for 6 weeks.  Diet: routine diet Anticipated Birth Control: Unsure Postpartum Appointment:4 weeks Additional Postpartum F/U:  none Future Appointments:No future appointments. Follow up Visit:  Follow-up Information     Jerelyn Charles, MD Follow up in 4 week(s).   Specialty: Obstetrics Contact information: [address] [place]  [address] [phone]                     01/17/2021 Rowland Lathe, MD

## 2021-01-17 NOTE — Lactation Note (Addendum)
This note was copied from a baby's chart. Lactation Consultation Note  Patient Name: Mary Myers Today's Date: 01/17/2021 Reason for consult: Follow-up assessment;Early term 37-38.6wks;Infant weight loss Age:29 hours - 4 % weight loss / Bilirubin - low intermediate.  Mom confirmed it had been since 2:45 am when baby last fed 20 ml of formula.  LC recommended STS and offering the breast/ baby rooting and LC assisted for the latch on the left breast / football / fed 12 mins with increased swallows and depth/ per mom comfortable. Latch score 8-9  LC changed a wet and transitional stool / and mom was able to latch on the baby onto the right breast /cross cradle with depth and fed 13 mins/ and released/  Mom denies sore nipples. D/C teaching reviewed for breast feeding.  Mom has the [facility] brochure with BF resources and per mom has a English speaking friend whom could call if needed.   Maternal Data Has patient been taught Hand Expression?: Yes  Feeding Mother's Current Feeding Choice: Breast Milk and Formula  LATCH Score Latch: Grasps breast easily, tongue down, lips flanged, rhythmical sucking.  Audible Swallowing: Spontaneous and intermittent  Type of Nipple: Everted at rest and after stimulation  Comfort (Breast/Nipple): Filling, red/small blisters or bruises, mild/mod discomfort  Hold (Positioning): No assistance needed to correctly position infant at breast.  LATCH Score: 9   Lactation Tools Discussed/Used    Interventions Interventions: Breast feeding basics reviewed;Skin to skin;Hand express;Support pillows;Breast compression;Position options;Hand pump;Education  Discharge Discharge Education: Engorgement and breast care;Warning signs for feeding baby Pump: Manual  Consult Status Consult Status: Complete Date: 01/17/21 Follow-up type: In-patient    [name] 01/17/2021, 9:19 AM

## 2021-01-28 ENCOUNTER — Telehealth (HOSPITAL_COMMUNITY): Payer: Self-pay | Admitting: *Deleted

## 2021-01-28 NOTE — Telephone Encounter (Signed)
Unable to leave message due to full mailbox.  Duffy Rhody, RN 01/28/2021 at 10:42am.

## 2021-04-22 ENCOUNTER — Encounter (HOSPITAL_COMMUNITY): Payer: Self-pay | Admitting: Emergency Medicine

## 2021-04-22 ENCOUNTER — Ambulatory Visit (HOSPITAL_COMMUNITY)
Admission: EM | Admit: 2021-04-22 | Discharge: 2021-04-22 | Disposition: A | Discharge status: Home / Self Care | Payer: Medicaid Other | Attending: Emergency Medicine | Admitting: Emergency Medicine

## 2021-04-22 ENCOUNTER — Other Ambulatory Visit: Payer: Self-pay

## 2021-04-22 DIAGNOSIS — K219 Gastro-esophageal reflux disease without esophagitis: Secondary | ICD-10-CM

## 2021-04-22 MED ORDER — MAGNESIUM HYDROXIDE 311 MG PO CHEW: 311.0000 mg | CHEWABLE_TABLET | ORAL | 0 refills | Status: DC | PRN

## 2021-04-22 NOTE — ED Triage Notes (Signed)
Pt c/o sore throat for 2 months. Reports it will ger better than come back,.  Pt breastfeeding baby 93 months old.

## 2021-04-22 NOTE — ED Provider Notes (Signed)
[facility]    CSN: [id number] Arrival date & time: 04/22/21  1804      History   Chief Complaint Chief Complaint  Patient presents with   Sore Throat    HPI Mary Myers is a 30 y.o. female.   Patient here for evaluation of sore throat and painful swallowing that has been ongoing for the past 2 months.  Reports also having some difficulty swallowing at times.  Denies any fevers at home.  Reports sore throat has been worse since having her baby.  Reports pain is worse after eating spicy or hot foods.  Has not taken any OTC medications or treatments as she is breast-feeding.  Denies any trauma, injury, or other precipitating event.  Denies any specific alleviating or aggravating factors.  Denies any chest pain, shortness of breath, N/V/D, numbness, tingling, weakness, abdominal pain, or headaches.    The history is provided by the patient.  Sore Throat   History reviewed. No pertinent past medical history.  Patient Active Problem List   Diagnosis Date Noted   Normal labor 01/15/2021   Post-dates pregnancy 07/17/2019   Pregnancy 07/17/2019    History reviewed. No pertinent surgical history.  OB History     Gravida  2   Para  2   Term  2   Preterm      AB      Living  2      SAB      IAB      Ectopic      Multiple  0   Live Births  2            Home Medications    Prior to Admission medications   Medication Sig Start Date End Date Taking? Authorizing Provider  magnesium hydroxide (PHILLIPS CHEWS) 311 MG CHEW chewable tablet Chew 1 tablet (311 mg total) by mouth every 4 (four) hours as needed. 04/22/21  Yes Ivette Loyal, NP  acetaminophen (TYLENOL) 325 MG tablet Take 2 tablets (650 mg total) by mouth every 6 (six) hours as needed (for pain scale < 4). 01/17/21   Charlett Nose, MD  ibuprofen (ADVIL) 200 MG tablet Take 3 tablets (600 mg total) by mouth every 6 (six) hours as needed. 01/17/21   Charlett Nose, MD  Prenatal Vit-Fe  Fumarate-FA (PRENATAL MULTIVITAMIN) TABS tablet Take 1 tablet by mouth daily at 12 noon.    [provider]    Family History Family History  Family history unknown: Yes    Social History Social History   Tobacco Use   Smoking status: Never   Smokeless tobacco: Never  Vaping Use   Vaping Use: Never used  Substance Use Topics   Alcohol use: Never   Drug use: Never     Allergies   Patient has no known allergies.   Review of Systems Review of Systems  HENT:  Positive for sore throat and trouble swallowing.   All other systems reviewed and are negative.   Physical Exam Triage Vital Signs ED Triage Vitals  Enc Vitals Group     BP 04/22/21 1824 118/83     Pulse Rate 04/22/21 1824 77     Resp 04/22/21 1824 16     Temp 04/22/21 1824 99.2 F (37.3 C)     Temp Source 04/22/21 1824 Oral     SpO2 04/22/21 1824 98 %     Weight --      Height --  Head Circumference --      Peak Flow --      Pain Score 04/22/21 1822 7     Pain Loc --      Pain Edu? --      Excl. in GC? --    No data found.  Updated Vital Signs BP 118/83 (BP Location: Right Arm)   Pulse 77   Temp 99.2 F (37.3 C) (Oral)   Resp 16   SpO2 98%   Breastfeeding Yes   Visual Acuity Right Eye Distance:   Left Eye Distance:   Bilateral Distance:    Right Eye Near:   Left Eye Near:    Bilateral Near:     Physical Exam Vitals and nursing note reviewed.  Constitutional:      General: She is not in acute distress.    Appearance: Normal appearance. She is not ill-appearing, toxic-appearing or diaphoretic.  HENT:     Head: Normocephalic and atraumatic.     Nose: No congestion.     Mouth/Throat:     Mouth: Mucous membranes are moist.     Pharynx: Uvula midline. No pharyngeal swelling or posterior oropharyngeal erythema.     Tonsils: 0 on the right. 0 on the left.  Eyes:     Conjunctiva/sclera: Conjunctivae normal.  Neck:     Thyroid: No thyroid mass or thyroid tenderness.      Trachea: Trachea normal.     Comments: Reports pain primarily at the base of neck Cardiovascular:     Rate and Rhythm: Normal rate and regular rhythm.     Pulses: Normal pulses.     Heart sounds: Normal heart sounds.  Pulmonary:     Effort: Pulmonary effort is normal.     Breath sounds: Normal breath sounds.  Abdominal:     General: Abdomen is flat.  Musculoskeletal:        General: Normal range of motion.     Cervical back: Full passive range of motion without pain, normal range of motion and neck supple.  Lymphadenopathy:     Cervical: No cervical adenopathy.  Skin:    General: Skin is warm and dry.  Neurological:     General: No focal deficit present.     Mental Status: She is alert and oriented to person, place, and time.  Psychiatric:        Mood and Affect: Mood normal.     UC Treatments / Results  Labs (all labs ordered are listed, but only abnormal results are displayed) Labs Reviewed - No data to display  EKG   Radiology No results found.  Procedures Procedures (including critical care time)  Medications Ordered in UC Medications - No data to display  Initial Impression / Assessment and Plan / UC Course  I have reviewed the triage vital signs and the nursing notes.  Pertinent labs & imaging results that were available during my care of the patient were reviewed by me and considered in my medical decision making (see chart for details).    Assessment negative for red flags or concerns.  Throat pain difficulty swallowing likely related to acid reflux due to an increase in the amount of spicy and hot foods that she has been eating.  We will treat with milk of magnesia as it is safe to take while continuing to breast-feed.  Encouraged fluid intake especially water.  Recommend avoiding spicy, hot, fried, or high acidity foods.  Instructed patient that she needs to not lay down immediately after  eating.  Follow-up with primary care provider for reevaluation as soon  as possible. Final Clinical Impressions(s) / UC Diagnoses   Final diagnoses:  Gastroesophageal reflux disease, unspecified whether esophagitis present     Discharge Instructions      Take the magnesium hydroxide as needed for throat pain.   Make sure you are drinking plenty of water.   Do not lay flat after eating.  Try to avoid fried, spicy, or high acidity foods.   You can take Tylenol and/or Ibuprofen as needed for pain relief and fever reduction.   Return or go to the Emergency Department if symptoms worsen or do not improve in the next few days.      ED Prescriptions     Medication Sig Dispense Auth. Provider   magnesium hydroxide (PHILLIPS CHEWS) 311 MG CHEW chewable tablet Chew 1 tablet (311 mg total) by mouth every 4 (four) hours as needed. 90 tablet Ivette Loyal, NP      PDMP not reviewed this encounter.   Ivette Loyal, NP 04/22/21 [id number]

## 2021-04-22 NOTE — Discharge Instructions (Addendum)
Take the magnesium hydroxide as needed for throat pain.   Make sure you are drinking plenty of water.   Do not lay flat after eating.  Try to avoid fried, spicy, or high acidity foods.   You can take Tylenol and/or Ibuprofen as needed for pain relief and fever reduction.   Return or go to the Emergency Department if symptoms worsen or do not improve in the next few days.

## 2021-06-11 ENCOUNTER — Ambulatory Visit (HOSPITAL_COMMUNITY)
Admission: EM | Admit: 2021-06-11 | Discharge: 2021-06-11 | Disposition: A | Discharge status: Home / Self Care | Payer: Medicaid Other | Attending: Family Medicine | Admitting: Family Medicine

## 2021-06-11 ENCOUNTER — Encounter (HOSPITAL_COMMUNITY): Payer: Self-pay | Admitting: Emergency Medicine

## 2021-06-11 ENCOUNTER — Other Ambulatory Visit: Payer: Self-pay

## 2021-06-11 DIAGNOSIS — K2100 Gastro-esophageal reflux disease with esophagitis, without bleeding: Secondary | ICD-10-CM | POA: Diagnosis not present

## 2021-06-11 MED ORDER — FAMOTIDINE 20 MG PO TABS: 20.0000 mg | ORAL_TABLET | Freq: Two times a day (BID) | ORAL | 0 refills | Status: DC | PRN

## 2021-06-11 NOTE — ED Provider Notes (Signed)
Renaldo Fiddler    CSN: [id number] Arrival date & time: 06/11/21  1745      History   Chief Complaint Chief Complaint  Patient presents with   Sore Throat    HPI Mary Myers is a 30 y.o. female.   HPI Patient presents today for evaluation of continued blood in chest burning along with dysphagia-like symptoms.  Patient was seen for the same course of symptoms on 04/23/2019.  But reports she never started medical medications.  She was diagnosed at that time with esophagitis along with GERD.  She was also recommended to follow-up with her primary care provider which she has not been able to do.  She is in today for evaluation and treatment.  She denies any nausea, vomiting, diarrhea or any recent changes to diet.  She is breast-feeding. History reviewed. No pertinent past medical history.  Patient Active Problem List   Diagnosis Date Noted   Normal labor 01/15/2021   Post-dates pregnancy 07/17/2019   Pregnancy 07/17/2019    History reviewed. No pertinent surgical history.  OB History     Gravida  2   Para  2   Term  2   Preterm      AB      Living  2      SAB      IAB      Ectopic      Multiple  0   Live Births  2            Home Medications    Prior to Admission medications   Medication Sig Start Date End Date Taking? Authorizing Provider  famotidine (PEPCID) 20 MG tablet Take 1 tablet (20 mg total) by mouth 2 (two) times daily as needed for heartburn or indigestion (esophagitis and acid indigestion). 06/11/21  Yes Bing Neighbors, FNP  acetaminophen (TYLENOL) 325 MG tablet Take 2 tablets (650 mg total) by mouth every 6 (six) hours as needed (for pain scale < 4). 01/17/21   Charlett Nose, MD  ibuprofen (ADVIL) 200 MG tablet Take 3 tablets (600 mg total) by mouth every 6 (six) hours as needed. 01/17/21   Charlett Nose, MD  magnesium hydroxide (PHILLIPS CHEWS) 311 MG CHEW chewable tablet Chew 1 tablet (311 mg total) by mouth every 4  (four) hours as needed. 04/22/21   Ivette Loyal, NP  Prenatal Vit-Fe Fumarate-FA (PRENATAL MULTIVITAMIN) TABS tablet Take 1 tablet by mouth daily at 12 noon.    [provider]    Family History Family History  Family history unknown: Yes    Social History Social History   Tobacco Use   Smoking status: Never   Smokeless tobacco: Never  Vaping Use   Vaping Use: Never used  Substance Use Topics   Alcohol use: Never   Drug use: Never     Allergies   Patient has no known allergies.   Review of Systems Review of Systems Pertinent negatives listed in HPI   Physical Exam Triage Vital Signs ED Triage Vitals  Enc Vitals Group     BP 06/11/21 1933 122/85     Pulse Rate 06/11/21 1933 78     Resp 06/11/21 1933 15     Temp 06/11/21 1938 98.8 F (37.1 C)     Temp Source 06/11/21 1938 Oral     SpO2 06/11/21 1933 100 %     Weight --      Height --      Head Circumference --  Peak Flow --      Pain Score 06/11/21 1934 8     Pain Loc --      Pain Edu? --      Excl. in GC? --    No data found.  Updated Vital Signs BP 122/85 (BP Location: Left Arm)   Pulse 78   Temp 98.8 F (37.1 C) (Oral)   Resp 15   SpO2 100%   Visual Acuity Right Eye Distance:   Left Eye Distance:   Bilateral Distance:    Right Eye Near:   Left Eye Near:    Bilateral Near:     Physical Exam General appearance: Alert, well developed, well nourished, cooperative and in no distress Head: Normocephalic, without obvious abnormality, atraumatic Respiratory: Respirations even and unlabored, normal respiratory rate Heart: Rate and rhythm normal. No gallop or murmurs noted on exam  Abdomen: BS +, no distention, no rebound tenderness, or no mass Extremities: No gross deformities Skin: Skin color, texture, turgor normal. No rashes seen  Psych: Appropriate mood and affect. Neurologic: No obvious focal neurological abnormalities present on exam UC Treatments / Results  Labs (all  labs ordered are listed, but only abnormal results are displayed) Labs Reviewed - No data to display  EKG   Radiology No results found.  Procedures Procedures (including critical care time)  Medications Ordered in UC Medications - No data to display  Initial Impression / Assessment and Plan / UC Course  I have reviewed the triage vital signs and the nursing notes.  Pertinent labs & imaging results that were available during my care of the patient were reviewed by me and considered in my medical decision making (see chart for details).    Physical exam grossly unremarkable. Medical given clinical presentation of symptoms treating for acute esophagitis secondary to GERD.  Education given regarding foods that can cause GERD symptoms to precipitate. Encouraged to start medication and take as prescribed.  Hydrate well with fluids.  Follow-up with GI symptoms do not improve. Final Clinical Impressions(s) / UC Diagnoses   Final diagnoses:  Gastroesophageal reflux disease with esophagitis without hemorrhage   Discharge Instructions   None    ED Prescriptions     Medication Sig Dispense Auth. Provider   famotidine (PEPCID) 20 MG tablet Take 1 tablet (20 mg total) by mouth 2 (two) times daily as needed for heartburn or indigestion (esophagitis and acid indigestion). 60 tablet Bing Neighbors, FNP      PDMP not reviewed this encounter.   Bing Neighbors, [place] 06/17/21 [phone]

## 2021-06-11 NOTE — ED Triage Notes (Signed)
Pt c/o sore throat for over 3 months. Was seen here before and told has acid reflux. Reports that has been busy and never got the prescriptions that was prescribed last time seen so come back since didn't take medications and still having same problem.

## 2021-11-02 ENCOUNTER — Ambulatory Visit (HOSPITAL_COMMUNITY)
Admission: EM | Admit: 2021-11-02 | Discharge: 2021-11-02 | Disposition: A | Discharge status: Home / Self Care | Payer: Medicaid Other | Attending: Internal Medicine | Admitting: Internal Medicine

## 2021-11-02 ENCOUNTER — Other Ambulatory Visit: Payer: Self-pay

## 2021-11-02 ENCOUNTER — Encounter (HOSPITAL_COMMUNITY): Payer: Self-pay | Admitting: Emergency Medicine

## 2021-11-02 DIAGNOSIS — M542 Cervicalgia: Secondary | ICD-10-CM

## 2021-11-02 DIAGNOSIS — K219 Gastro-esophageal reflux disease without esophagitis: Secondary | ICD-10-CM | POA: Diagnosis present

## 2021-11-02 DIAGNOSIS — J029 Acute pharyngitis, unspecified: Secondary | ICD-10-CM

## 2021-11-02 LAB — POCT RAPID STREP A, ED / UC: Streptococcus, Group A Screen (Direct): NEGATIVE

## 2021-11-02 MED ORDER — CYCLOBENZAPRINE HCL 5 MG PO TABS: 5.0000 mg | ORAL_TABLET | Freq: Two times a day (BID) | ORAL | 0 refills | Status: DC | PRN

## 2021-11-02 MED ORDER — FAMOTIDINE 20 MG PO TABS: 20.0000 mg | ORAL_TABLET | Freq: Two times a day (BID) | ORAL | 0 refills | Status: DC | PRN

## 2021-11-02 NOTE — ED Notes (Signed)
Swab obtained, labeled and placed in lab ?

## 2021-11-02 NOTE — Discharge Instructions (Addendum)
Strep was negative.  Throat culture is pending.  We will call if it is positive.  It is possible that your sore throat is related to reflux given your history so you are being treated with Pepcid to take as needed.  You have also been prescribed a muscle relaxer to take as needed for your neck pain.  Please be advised that this can cause drowsiness and do not drive while taking this medication.  Follow-up if symptoms persist or worsen.  Also follow-up with provided contact information for gastroenterology to have your reflux further evaluated. ?

## 2021-11-02 NOTE — ED Triage Notes (Addendum)
Utilizing translate service.  Patient has neck pain and a sore throat.  Neck pain started 3 days ago.  Sore throat worsened yesterday.  Unable to swallow today due to pain.   ? ?No ear pain.  Thinks right neck pain may be related to carrying 35 month old child  ?

## 2021-11-02 NOTE — ED Provider Notes (Signed)
?[facility] ? ? ? ?CSN: [id number] ?Arrival date & time: 11/02/21  1225 ? ? ?  ? ?History   ?Chief Complaint ?Chief Complaint  ?Patient presents with  ? Sore Throat  ? ? ?HPI ?Mary Myers is a 31 y.o. female.  ? ?Patient presents with 2 different chief complaints today.  Patient reports that she has a sore throat that has been present over the past few days.  Patient reports painful swallowing and feelings that like "something is stuck in her throat".  Has associated runny nose that started approximately 1 week ago but denies any associated cough, chest pain, shortness of breath, ear pain, nausea, vomiting, diarrhea, abdominal pain.  Denies any known fevers or sick contacts.  Patient reports history of reflux with similar sore throat and has had resolution with antireflux medications in the past, although she states that she has run out of these medications. ? ?Patient also presenting with right-sided neck pain that extends down to shoulder that has been present for approximately 1 week.  Denies any recent injury.  She reports that it may be related to carrying her 65-month-old child.  Patient has not taken medications for pain.  Pain is exacerbated with movement.  Has history of chronic neck pain.  Denies any numbness or tingling.  Pain does not radiate down arms. ? ? ?Sore Throat ? ? ?History reviewed. No pertinent past medical history. ? ?Patient Active Problem List  ? Diagnosis Date Noted  ? Normal labor 01/15/2021  ? Post-dates pregnancy 07/17/2019  ? Pregnancy 07/17/2019  ? ? ?History reviewed. No pertinent surgical history. ? ?OB History   ? ? Gravida  ?2  ? Para  ?2  ? Term  ?2  ? Preterm  ?   ? AB  ?   ? Living  ?2  ?  ? ? SAB  ?   ? IAB  ?   ? Ectopic  ?   ? Multiple  ?0  ? Live Births  ?2  ?   ?  ?  ? ? ? ?Home Medications   ? ?Prior to Admission medications   ?Medication Sig Start Date End Date Taking? Authorizing Provider  ?acetaminophen (TYLENOL) 325 MG tablet Take 2 tablets (650 mg total) by  mouth every 6 (six) hours as needed (for pain scale < 4). 01/17/21  Yes Charlett Nose, MD  ?cyclobenzaprine (FLEXERIL) 5 MG tablet Take 1 tablet (5 mg total) by mouth 2 (two) times daily as needed for muscle spasms. 11/02/21  Yes Gustavus Bryant, FNP  ?famotidine (PEPCID) 20 MG tablet Take 1 tablet (20 mg total) by mouth 2 (two) times daily as needed for heartburn or indigestion. 11/02/21  Yes Heydy Montilla, Rolly Salter E, FNP  ?ibuprofen (ADVIL) 200 MG tablet Take 3 tablets (600 mg total) by mouth every 6 (six) hours as needed. 01/17/21   Charlett Nose, MD  ?magnesium hydroxide (PHILLIPS CHEWS) 311 MG CHEW chewable tablet Chew 1 tablet (311 mg total) by mouth every 4 (four) hours as needed. 04/22/21   Ivette Loyal, NP  ?Prenatal Vit-Fe Fumarate-FA (PRENATAL MULTIVITAMIN) TABS tablet Take 1 tablet by mouth daily at 12 noon.    [provider]  ? ? ?Family History ?Family History  ?Family history unknown: Yes  ? ? ?Social History ?Social History  ? ?Tobacco Use  ? Smoking status: Never  ? Smokeless tobacco: Never  ?Vaping Use  ? Vaping Use: Never used  ?Substance Use Topics  ? Alcohol use:  Never  ? Drug use: Never  ? ? ? ?Allergies   ?Patient has no known allergies. ? ? ?Review of Systems ?Review of Systems ?Per HPI ? ?Physical Exam ?Triage Vital Signs ?ED Triage Vitals  ?Enc Vitals Group  ?   BP 11/02/21 1351 107/74  ?   Pulse Rate 11/02/21 1351 91  ?   Resp 11/02/21 1351 18  ?   Temp 11/02/21 1351 99.3 ?F (37.4 ?C)  ?   Temp Source 11/02/21 1351 Oral  ?   SpO2 11/02/21 1351 99 %  ?   Weight --   ?   Height --   ?   Head Circumference --   ?   Peak Flow --   ?   Pain Score 11/02/21 1347 10  ?   Pain Loc --   ?   Pain Edu? --   ?   Excl. in GC? --   ? ?No data found. ? ?Updated Vital Signs ?BP 107/74 (BP Location: Left Arm)   Pulse 91   Temp 99.3 ?F (37.4 ?C) (Oral)   Resp 18   SpO2 99%  ? ?Visual Acuity ?Right Eye Distance:   ?Left Eye Distance:   ?Bilateral Distance:   ? ?Right Eye Near:   ?Left Eye Near:     ?Bilateral Near:    ? ?Physical Exam ?Constitutional:   ?   General: She is not in acute distress. ?   Appearance: Normal appearance. She is not toxic-appearing or diaphoretic.  ?HENT:  ?   Head: Normocephalic and atraumatic.  ?   Right Ear: Tympanic membrane and ear canal normal.  ?   Left Ear: Tympanic membrane and ear canal normal.  ?   Nose: Nose normal.  ?   Mouth/Throat:  ?   Mouth: Mucous membranes are moist.  ?   Pharynx: No posterior oropharyngeal erythema.  ?Eyes:  ?   Extraocular Movements: Extraocular movements intact.  ?   Conjunctiva/sclera: Conjunctivae normal.  ?Neck:  ?   Comments: No tenderness to palpation to right neck/shoulder/upper back.  Pain occurs with movement per patient.  Patient has full range of motion of neck.  Grip strength is 5/5.  Neurovascular intact.  No direct spinal tenderness, crepitus, step-off.  GERD.  GERD GERD throat culture is pending. ?Cardiovascular:  ?   Rate and Rhythm: Normal rate and regular rhythm.  ?   Pulses: Normal pulses.  ?   Heart sounds: Normal heart sounds.  ?Pulmonary:  ?   Effort: Pulmonary effort is normal. No respiratory distress.  ?   Breath sounds: Normal breath sounds.  ?Abdominal:  ?   General: Bowel sounds are normal. There is no distension.  ?   Palpations: Abdomen is soft.  ?   Tenderness: There is no abdominal tenderness.  ?Neurological:  ?   General: No focal deficit present.  ?   Mental Status: She is alert and oriented to person, place, and time. Mental status is at baseline.  ?Psychiatric:     ?   Mood and Affect: Mood normal.     ?   Behavior: Behavior normal.     ?   Thought Content: Thought content normal.     ?   Judgment: Judgment normal.  ? ? ? ?UC Treatments / Results  ?Labs ?(all labs ordered are listed, but only abnormal results are displayed) ?Labs Reviewed  ?CULTURE, GROUP A STREP [facility])  ?POCT RAPID STREP A, ED / UC  ? ? ?EKG ? ? ?  Radiology ?No results found. ? ?Procedures ?Procedures (including critical care  time) ? ?Medications Ordered in UC ?Medications - No data to display ? ?Initial Impression / Assessment and Plan / UC Course  ?I have reviewed the triage vital signs and the nursing notes. ? ?Pertinent labs & imaging results that were available during my care of the patient were reviewed by me and considered in my medical decision making (see chart for details). ? ?  ? ?Rapid strep was negative.  Differential diagnoses for sore throat include strep throat versus viral pharyngitis versus GERD. Throat culture is pending. Will opt to treat with pepcid in case esophagitis related to GERD is causing sore throat given patient's history. With chronicity of issue, patient was advised that it would be best to follow up with GI at provided contact information for further evaluation and management. No concern for meningitis with symptoms and neck pain.  ? ?Suspect muscular strain related to neck pain. Will treat with muscle relaxer as patient states that she is not breastfeeding. Do not think that imaging is necessary given no spinal tenderness and no injury. Given strict return precautions for all chief complaints. Patient verbalized understanding and was agreeable with plan. Interpreter used throughout patient interaction.  ?Final Clinical Impressions(s) / UC Diagnoses  ? ?Final diagnoses:  ?Sore throat  ?Neck pain  ?Gastroesophageal reflux disease, unspecified whether esophagitis present  ? ? ? ?Discharge Instructions   ? ?  ?Strep was negative.  Throat culture is pending.  We will call if it is positive.  It is possible that your sore throat is related to reflux given your history so you are being treated with Pepcid to take as needed.  You have also been prescribed a muscle relaxer to take as needed for your neck pain.  Please be advised that this can cause drowsiness and do not drive while taking this medication.  Follow-up if symptoms persist or worsen.  Also follow-up with provided contact information for  gastroenterology to have your reflux further evaluated. ? ? ? ? ?ED Prescriptions   ? ? Medication Sig Dispense Auth. Provider  ? cyclobenzaprine (FLEXERIL) 5 MG tablet Take 1 tablet (5 mg total) by mouth 2 (two) times daily as needed for

## 2021-11-05 LAB — CULTURE, GROUP A STREP (THRC)

## 2021-12-21 IMAGING — US US MFM OB FOLLOW-UP
1 series · 13 of 28 positions shown · non-contrast
Comparison: none

[Series 1: us mfm ob follow-up · 78 acquisitions, 13 frames shown]
[im 3/78]
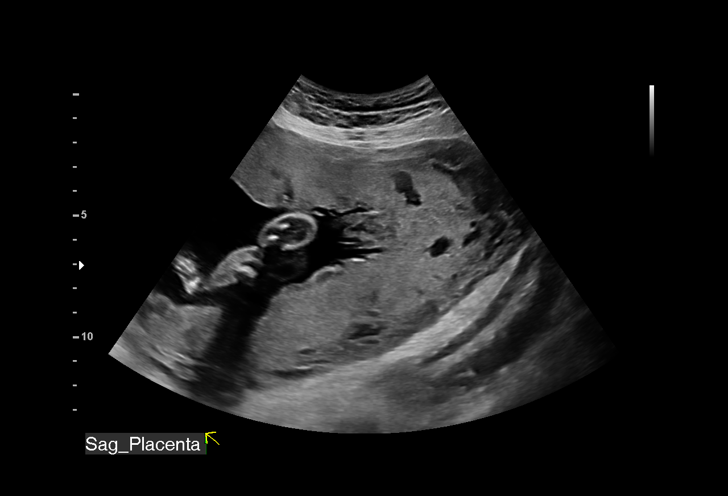
[im 9/78]
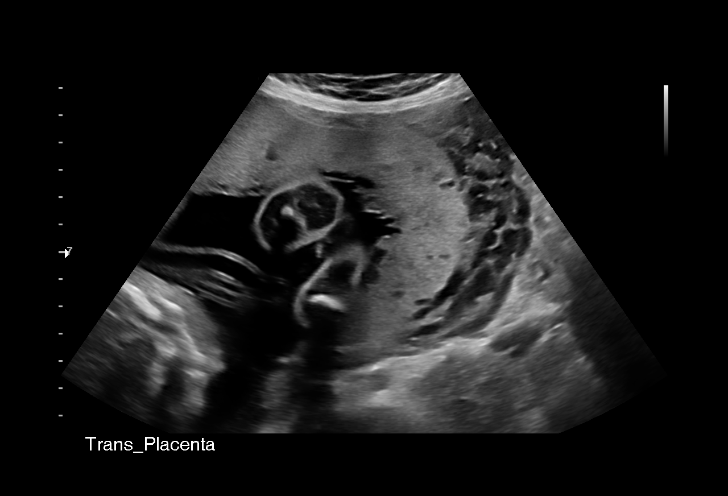
[im 15/78]
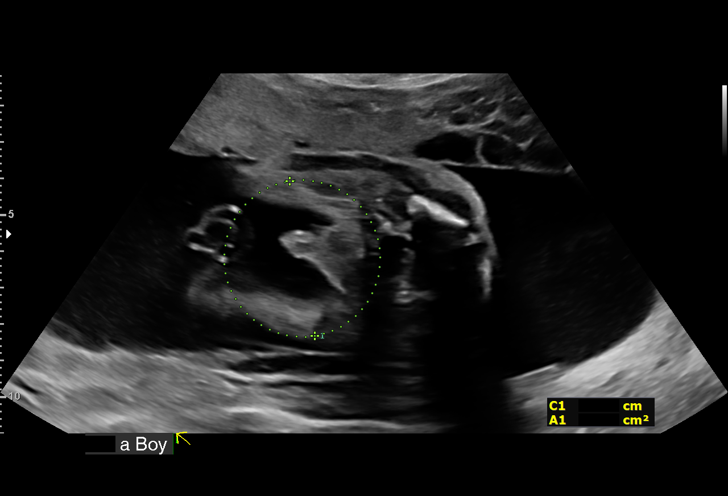
[im 20/78]
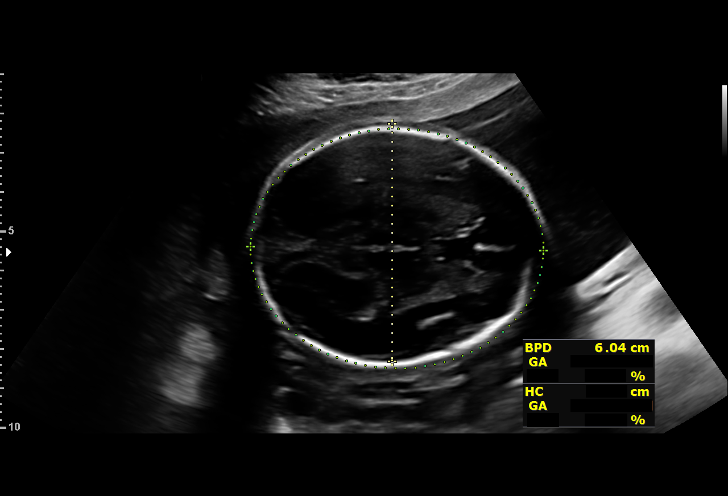
[im 26/78]
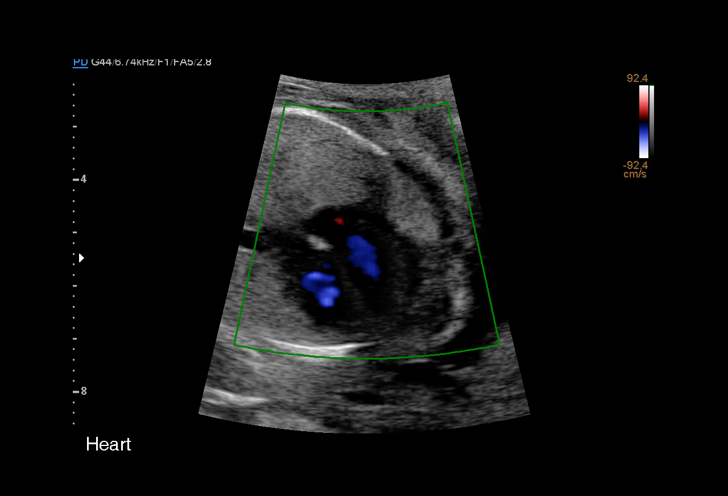
[im 32/78]
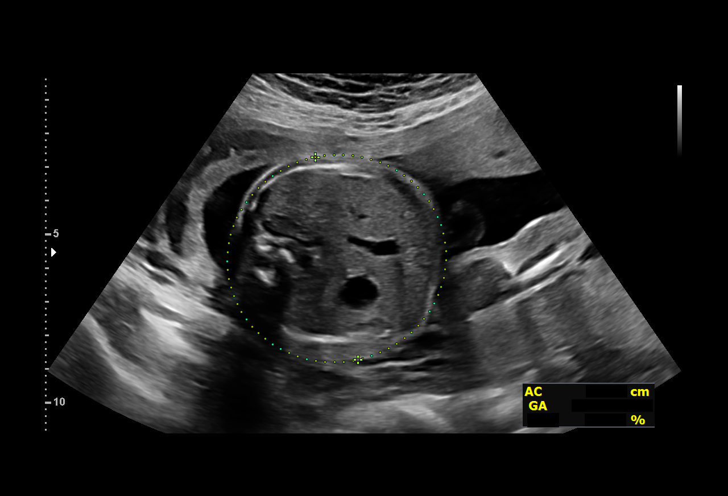
[im 40/78]
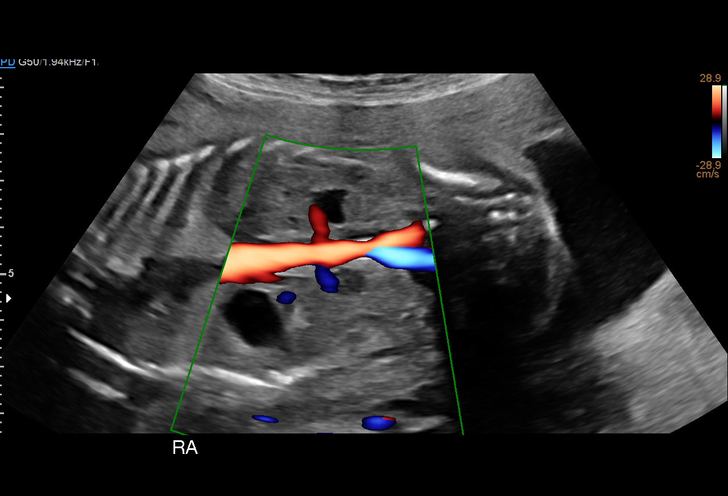
[im 46/78]
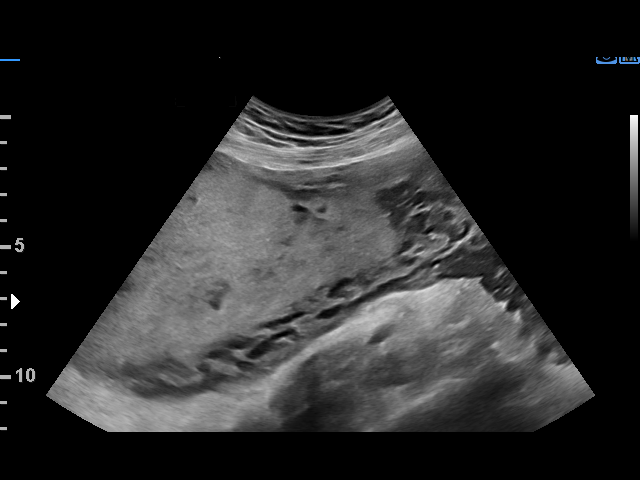
[im 52/78]
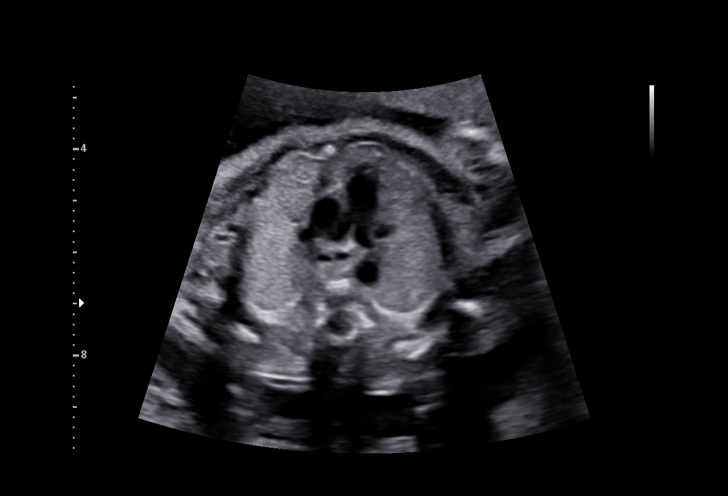
[im 58/78]
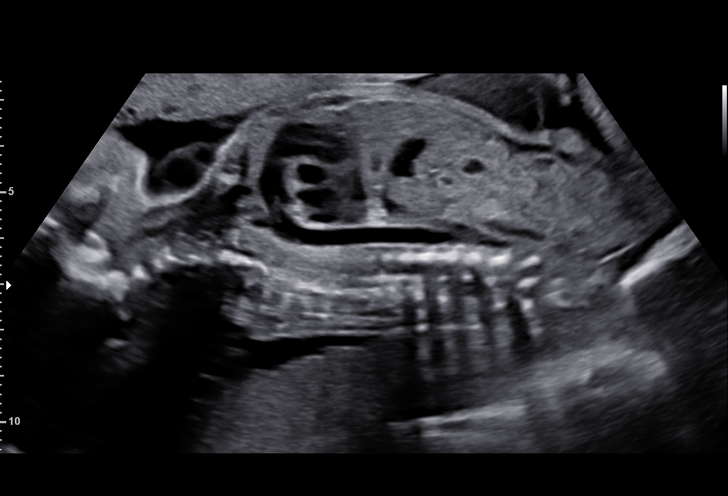
[im 63/78]
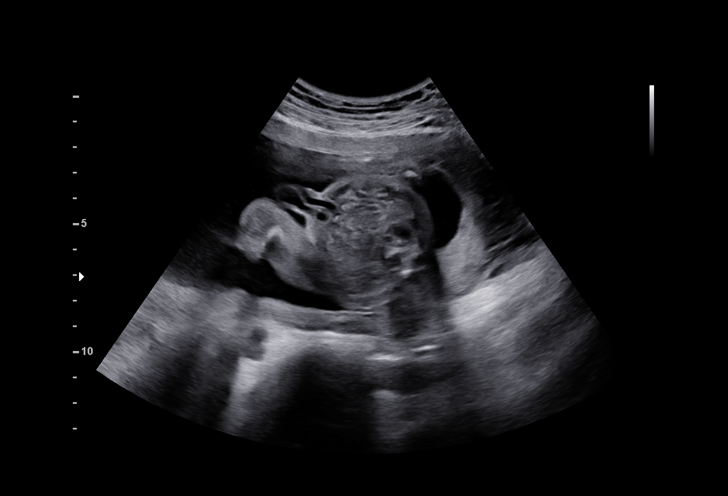
[im 69/78]
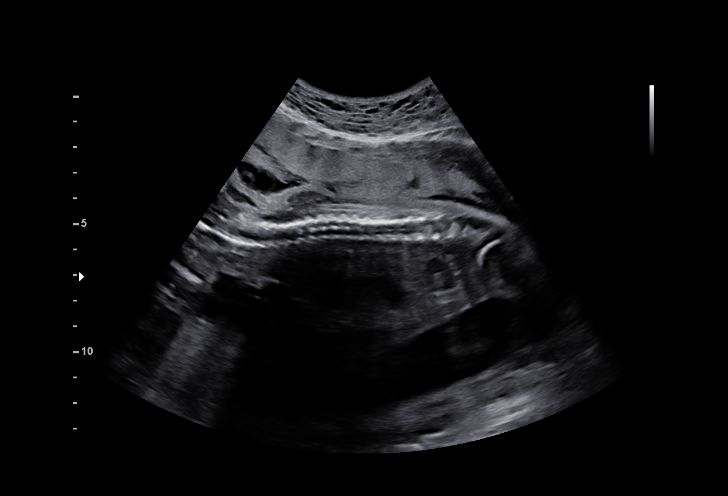
[im 75/78]
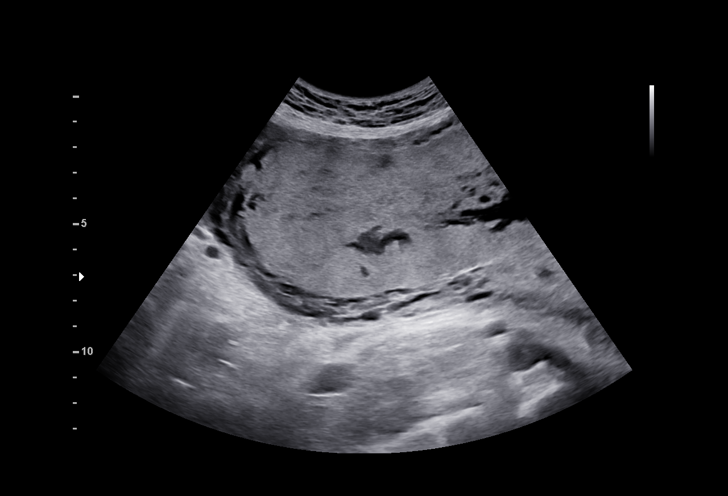

[13 of 28 positions shown; findings below may reference images not displayed]

Indications

 Echogenic intracardiac focus of the heart
 (EIF)
 Placenta previa specified as without
 hemorrhage, second trimester
 Encounter for antenatal screening for
 malformations (LR NIPS)
 23 weeks gestation of pregnancy
Fetal Evaluation

 Num Of Fetuses:         1
 Fetal Heart Rate(bpm):  148
 Cardiac Activity:       Observed
 Presentation:           Breech
 Placenta:               Posterior
 P. Cord Insertion:      Visualized, central

 Amniotic Fluid
 AFI FV:      Within normal limits

                             Largest Pocket(cm)

Biometry

 BPD:      60.3  mm     G. Age:  24w 4d         75  %    CI:        78.81   %    70 - 86
                                                         FL/HC:      18.9   %    18.7 -
 HC:      214.8  mm     G. Age:  23w 4d         26  %    HC/AC:      1.12        1.05 -
 AC:      191.8  mm     G. Age:  23w 6d         47  %    FL/BPD:     67.3   %    71 - 87
 FL:       40.6  mm     G. Age:  23w 1d         21  %    FL/AC:      21.2   %    20 - 24

 Est. FW:     612  gm      1 lb 6 oz     37  %
OB History

 Gravidity:    2
 Living:       1
Gestational Age

 LMP:           23w 5d        Date:  04/21/20                 EDD:   01/26/21
 U/S Today:     23w 6d                                        EDD:   01/25/21
 Best:          23w 5d     Det. By:  LMP  (04/21/20)          EDD:   01/26/21
Anatomy

 Cranium:               Appears normal         Aortic Arch:            Previously seen
 Cavum:                 Appears normal         Ductal Arch:            Previously seen
 Ventricles:            Previously seen        Diaphragm:              Appears normal
 Choroid Plexus:        Previously seen        Stomach:                Appears normal, left
                                                                       sided
 Cerebellum:            Previously seen        Abdomen:                Appears normal
 Posterior Fossa:       Previously seen        Abdominal Wall:         Previously seen
 Nuchal Fold:           Previously seen        Cord Vessels:           Previously seen
 Face:                  Orbits and profile     Kidneys:                Appear normal
                        previously seen
 Lips:                  Previously seen        Bladder:                Appears normal
 Thoracic:              Previously seen        Spine:                  Previously seen
 Heart:                 Appears normal; EIF    Upper Extremities:      Previously seen
 RVOT:                  Previously seen        Lower Extremities:      Previously seen
 LVOT:                  Appears normal

 Other:  Hands and feet prev visualized. Lenses prev visualized. Nasal bone
         prev visualized. Fetus appears to be a male.
Cervix Uterus Adnexa

 Cervix
 Length:           4.27  cm.
 Normal appearance by transabdominal scan.

 Uterus
 No abnormality visualized.

 Right Ovary
 Not visualized.

 Left Ovary
 Not visualized.

 Cul De Sac
 No free fluid seen.

 Adnexa
 No adnexal mass visualized.
Comments

 This patient was seen for a follow up exam as the views of
 the fetal anatomy were unable to be fully visualized and due
 to placenta previa noted during her last exam.  She denies
 any problems since her last exam.
 She was informed that the fetal growth and amniotic fluid
 level appears appropriate for her gestational age.
 The views of the fetal anatomy were visualized today.  There
 were no obvious anomalies noted, other than the previously
 noted echogenic focus in the left ventricle of the fetal heart.
 The limitations of ultrasound in the detection of all anomalies
 was discussed.
 The previously noted placenta previa has resolved.  As the
 placenta previa has resolved, she was advised that she may
 continue routine prenatal care and may attempt a vaginal
 delivery at term should she desire.
 No further exams were scheduled in our office.
 All conversations were held with the patient today with the
 help of a Vietnamese interpreter.

## 2022-12-11 ENCOUNTER — Encounter (HOSPITAL_COMMUNITY): Payer: Self-pay

## 2022-12-11 ENCOUNTER — Ambulatory Visit (HOSPITAL_COMMUNITY)
Admission: EM | Admit: 2022-12-11 | Discharge: 2022-12-11 | Disposition: A | Discharge status: Home / Self Care | Payer: Medicaid Other | Attending: Family Medicine | Admitting: Family Medicine

## 2022-12-11 DIAGNOSIS — K219 Gastro-esophageal reflux disease without esophagitis: Secondary | ICD-10-CM

## 2022-12-11 DIAGNOSIS — R1013 Epigastric pain: Secondary | ICD-10-CM | POA: Diagnosis not present

## 2022-12-11 LAB — POCT URINALYSIS DIP (MANUAL ENTRY)
Bilirubin, UA: NEGATIVE
Glucose, UA: NEGATIVE mg/dL
Ketones, POC UA: NEGATIVE mg/dL
Nitrite, UA: NEGATIVE
Protein Ur, POC: NEGATIVE mg/dL
Spec Grav, UA: 1.015 (ref 1.010–1.025)
Urobilinogen, UA: 0.2 E.U./dL
pH, UA: 6.5 (ref 5.0–8.0)

## 2022-12-11 LAB — POCT URINE PREGNANCY: Preg Test, Ur: NEGATIVE

## 2022-12-11 MED ORDER — LIDOCAINE VISCOUS HCL 2 % MT SOLN
15.0000 mL | Freq: Once | OROMUCOSAL | Status: AC
  Administered 2022-12-11: 15 mL via OROMUCOSAL

## 2022-12-11 MED ORDER — LIDOCAINE VISCOUS HCL 2 % MT SOLN
OROMUCOSAL | Status: AC
  Filled 2022-12-11: qty 15

## 2022-12-11 MED ORDER — ALUM & MAG HYDROXIDE-SIMETH 200-200-20 MG/5ML PO SUSP
ORAL | Status: AC
  Filled 2022-12-11: qty 30

## 2022-12-11 MED ORDER — ALUM & MAG HYDROXIDE-SIMETH 200-200-20 MG/5ML PO SUSP: 30.0000 mL | Freq: Once | ORAL | Status: DC

## 2022-12-11 MED ORDER — ALUM & MAG HYDROXIDE-SIMETH 200-200-20 MG/5ML PO SUSP
30.0000 mL | Freq: Once | ORAL | Status: AC
  Administered 2022-12-11: 30 mL via ORAL

## 2022-12-11 MED ORDER — FAMOTIDINE 40 MG PO TABS: 40.0000 mg | ORAL_TABLET | Freq: Every day | ORAL | 0 refills | Status: AC

## 2022-12-11 NOTE — ED Provider Notes (Addendum)
[facility]    CSN: [id number] Arrival date & time: 12/11/22  0818      History   Chief Complaint Chief Complaint  Patient presents with   Abdominal Pain    HPI Mary Myers is a 32 y.o. female.   HPI Patient with a history of GERD presents today with epigastric pain which began on yesterday.  Patient is unable to relate any specific intake of food that may have precipitated symptoms.  She reports taken some Pepto-Bismol because doing epigastric pain was so severe.  She reports Pepto did help her symptoms overnight.  She denies any diarrhea, constipation, nausea or vomiting.  She reports previously taking famotidine for the same symptoms and symptoms resolved however she did not continue medication.  History reviewed. No pertinent past medical history.  Patient Active Problem List   Diagnosis Date Noted   Normal labor 01/15/2021   Post-dates pregnancy 07/17/2019   Pregnancy 07/17/2019    History reviewed. No pertinent surgical history.  OB History     Gravida  2   Para  2   Term  2   Preterm      AB      Living  2      SAB      IAB      Ectopic      Multiple  0   Live Births  2            Home Medications    Prior to Admission medications   Medication Sig Start Date End Date Taking? Authorizing Provider  famotidine (PEPCID) 40 MG tablet Take 1 tablet (40 mg total) by mouth daily. 12/11/22 01/10/23 Yes Bing Neighbors, NP    Family History Family History  Family history unknown: Yes    Social History Social History   Tobacco Use   Smoking status: Never   Smokeless tobacco: Never  Vaping Use   Vaping Use: Never used  Substance Use Topics   Alcohol use: Never   Drug use: Never     Allergies   Patient has no known allergies.   Review of Systems Review of Systems Pertinent negatives listed in HPI   Physical Exam Triage Vital Signs ED Triage Vitals [12/11/22 0926]  Enc Vitals Group     BP (!) 134/94     Pulse Rate  81     Resp 18     Temp 98.6 F (37 C)     Temp Source Oral     SpO2 97 %     Weight      Height      Head Circumference      Peak Flow      Pain Score 8     Pain Loc      Pain Edu?      Excl. in GC?    No data found.  Updated Vital Signs BP (!) 134/94 (BP Location: Left Arm)   Pulse 81   Temp 98.6 F (37 C) (Oral)   Resp 18   SpO2 97%   Breastfeeding No   Visual Acuity Right Eye Distance:   Left Eye Distance:   Bilateral Distance:    Right Eye Near:   Left Eye Near:    Bilateral Near:     Physical Exam Vitals reviewed.  HENT:     Head: Normocephalic.  Eyes:     Extraocular Movements: Extraocular movements intact.  Cardiovascular:     Rate and Rhythm: Normal rate and  regular rhythm.  Pulmonary:     Effort: Pulmonary effort is normal.     Breath sounds: Normal breath sounds.  Abdominal:     General: Abdomen is flat. Bowel sounds are increased.     Palpations: Abdomen is soft.     Tenderness: There is abdominal tenderness in the epigastric area. There is no right CVA tenderness, left CVA tenderness, guarding or rebound.  Skin:    General: Skin is warm and dry.  Neurological:     General: No focal deficit present.     Mental Status: She is alert.      UC Treatments / Results  Labs (all labs ordered are listed, but only abnormal results are displayed) Labs Reviewed  POCT URINALYSIS DIP (MANUAL ENTRY) - Abnormal; Notable for the following components:      Result Value   Blood, UA trace-intact (*)    Leukocytes, UA Trace (*)    All other components within normal limits  POCT URINE PREGNANCY    EKG   Radiology No results found.  Procedures Procedures (including critical care time)  Medications Ordered in UC Medications  alum & mag hydroxide-simeth (MAALOX/MYLANTA) 200-200-20 MG/5ML suspension 30 mL (has no administration in time range)  lidocaine (XYLOCAINE) 2 % viscous mouth solution 15 mL (has no administration in time range)    Initial  Impression / Assessment and Plan / UC Course  I have reviewed the triage vital signs and the nursing notes.  Pertinent labs & imaging results that were available during my care of the patient were reviewed by me and considered in my medical decision making (see chart for details).    GI Cocktail given here in clinic.  Continue home management of GERD symptoms with famotidine 40 mg daily x 7 days then take as needed.  Strict ED precautions given if symptoms worsen or become severe. Final Clinical Impressions(s) / UC Diagnoses   Final diagnoses:  Epigastric pain  Gastroesophageal reflux disease without esophagitis     Discharge Instructions      Take famotidine daily for 7 days, then as needed for GERD symptoms      ED Prescriptions     Medication Sig Dispense Auth. Provider   famotidine (PEPCID) 40 MG tablet Take 1 tablet (40 mg total) by mouth daily. 30 tablet Bing Neighbors, NP      PDMP not reviewed this encounter.   Bing Neighbors, NP 12/11/22 1007    Bing Neighbors, NP 12/11/22 1008

## 2022-12-11 NOTE — ED Triage Notes (Signed)
Pt c/o upper abdominal pain since 5pm yesterday. States took [employer] and had diarrhea an hour later and felt better. States had same sx's last year and it was acid reflux.

## 2022-12-11 NOTE — Discharge Instructions (Addendum)
Take famotidine daily for 7 days, then take as needed for GERD symptoms.  If your abdominal pain becomes severe go immediately to the emergency department.
